# Patient Record
Sex: Female | Born: 1980 | Race: White | Hispanic: No | Marital: Married | Smoking: Never smoker
Health system: Southern US, Community
[De-identification: ages and names within clinical notes are randomized; demographics above are authoritative.]

## PROBLEM LIST (undated history)

## (undated) DIAGNOSIS — R102 Pelvic and perineal pain: Secondary | ICD-10-CM

## (undated) DIAGNOSIS — M5412 Radiculopathy, cervical region: Secondary | ICD-10-CM

## (undated) DIAGNOSIS — F419 Anxiety disorder, unspecified: Secondary | ICD-10-CM

## (undated) DIAGNOSIS — N946 Dysmenorrhea, unspecified: Secondary | ICD-10-CM

## (undated) DIAGNOSIS — Z8489 Family history of other specified conditions: Secondary | ICD-10-CM

## (undated) DIAGNOSIS — M4802 Spinal stenosis, cervical region: Secondary | ICD-10-CM

## (undated) DIAGNOSIS — D649 Anemia, unspecified: Secondary | ICD-10-CM

## (undated) DIAGNOSIS — N923 Ovulation bleeding: Secondary | ICD-10-CM

## (undated) DIAGNOSIS — E039 Hypothyroidism, unspecified: Secondary | ICD-10-CM

## (undated) DIAGNOSIS — Z9889 Other specified postprocedural states: Secondary | ICD-10-CM

## (undated) DIAGNOSIS — E282 Polycystic ovarian syndrome: Secondary | ICD-10-CM

## (undated) DIAGNOSIS — R7303 Prediabetes: Secondary | ICD-10-CM

## (undated) DIAGNOSIS — T8859XA Other complications of anesthesia, initial encounter: Secondary | ICD-10-CM

## (undated) DIAGNOSIS — R112 Nausea with vomiting, unspecified: Secondary | ICD-10-CM

## (undated) DIAGNOSIS — E0590 Thyrotoxicosis, unspecified without thyrotoxic crisis or storm: Secondary | ICD-10-CM

## (undated) HISTORY — DX: Ovulation bleeding: N92.3

## (undated) HISTORY — PX: DILATION AND CURETTAGE OF UTERUS: SHX78

## (undated) HISTORY — DX: Thyrotoxicosis, unspecified without thyrotoxic crisis or storm: E05.90

## (undated) HISTORY — DX: Polycystic ovarian syndrome: E28.2

## (undated) HISTORY — PX: BREAST LUMPECTOMY: SHX2

## (undated) HISTORY — DX: Pelvic and perineal pain: R10.2

## (undated) HISTORY — DX: Dysmenorrhea, unspecified: N94.6

---

## 2006-05-18 ENCOUNTER — Emergency Department: Payer: Self-pay | Admitting: Emergency Medicine

## 2007-11-25 ENCOUNTER — Observation Stay: Payer: Self-pay

## 2007-11-26 ENCOUNTER — Observation Stay: Payer: Self-pay | Admitting: Obstetrics and Gynecology

## 2008-01-19 ENCOUNTER — Inpatient Hospital Stay: Payer: Self-pay

## 2009-07-30 ENCOUNTER — Inpatient Hospital Stay: Payer: Self-pay

## 2010-02-22 ENCOUNTER — Emergency Department: Payer: Self-pay | Admitting: Emergency Medicine

## 2010-08-08 HISTORY — PX: HYSTEROSCOPY: SHX211

## 2010-08-19 ENCOUNTER — Ambulatory Visit: Payer: Self-pay | Admitting: Unknown Physician Specialty

## 2010-08-30 ENCOUNTER — Ambulatory Visit: Payer: Self-pay | Admitting: Unknown Physician Specialty

## 2014-09-28 LAB — HM PAP SMEAR: HM Pap smear: NEGATIVE

## 2015-04-27 LAB — TSH: TSH: 1.48 u[IU]/mL (ref 0.41–5.90)

## 2015-04-27 LAB — BASIC METABOLIC PANEL
Glucose: 76 mg/dL
Potassium: 4.1 mmol/L (ref 3.4–5.3)

## 2015-07-30 ENCOUNTER — Encounter: Payer: Self-pay | Admitting: *Deleted

## 2015-08-01 ENCOUNTER — Ambulatory Visit (INDEPENDENT_AMBULATORY_CARE_PROVIDER_SITE_OTHER): Payer: Self-pay | Admitting: Obstetrics and Gynecology

## 2015-08-01 ENCOUNTER — Encounter: Payer: Self-pay | Admitting: Obstetrics and Gynecology

## 2015-08-01 VITALS — BP 117/80 | HR 82 | Wt 132.6 lb

## 2015-08-01 DIAGNOSIS — N926 Irregular menstruation, unspecified: Secondary | ICD-10-CM

## 2015-08-01 DIAGNOSIS — N930 Postcoital and contact bleeding: Secondary | ICD-10-CM

## 2015-08-01 DIAGNOSIS — R5383 Other fatigue: Secondary | ICD-10-CM

## 2015-08-01 DIAGNOSIS — Z8742 Personal history of other diseases of the female genital tract: Secondary | ICD-10-CM

## 2015-08-01 NOTE — Progress Notes (Signed)
Subjective:     Patient ID: Donna Mejia, female   DOB: 1981/05/08, 34 y.o.   MRN: [id number]  HPI Reports regular menses with spotting 3-4 days around time of ovulation x 6 months; h/o PCOS with irregular menses  Review of Systems Menstrual history as follows: May 4-7 spotting May 24-29 menses June 8-9 spotting June 23-29 menses July 10-13 spotting July 25-31 menses Aug 14-18 spotting Aug 21 large gush bright red blood after sex  Feels bloating when spotting and really foggy and tired.    Objective:   Physical Exam A&O x4 Well groomed female in no current distress Pelvic exam: normal external genitalia, vulva, vagina, cervix, uterus and adnexa, CERVIX: normal appearing cervix without discharge or lesions, Nabothian cyst at 2 o'clock. UA negative    Assessment:     BTB between menses with bloating postcoital bleeding secondary to nabothian cyst  h/o PCOS    Plan:     Pelvic u/s between next menses  cunseled on ruptured nabothian cyst  Gennie Alma, CNM

## 2015-08-21 ENCOUNTER — Ambulatory Visit: Payer: Self-pay | Admitting: Obstetrics and Gynecology

## 2015-08-21 ENCOUNTER — Other Ambulatory Visit: Payer: Self-pay

## 2015-08-28 ENCOUNTER — Ambulatory Visit (INDEPENDENT_AMBULATORY_CARE_PROVIDER_SITE_OTHER): Payer: 59 | Admitting: Obstetrics and Gynecology

## 2015-08-28 ENCOUNTER — Encounter: Payer: Self-pay | Admitting: Obstetrics and Gynecology

## 2015-08-28 ENCOUNTER — Ambulatory Visit: Payer: 59

## 2015-08-28 VITALS — BP 113/70 | HR 65 | Ht 66.0 in | Wt 129.7 lb

## 2015-08-28 DIAGNOSIS — N926 Irregular menstruation, unspecified: Secondary | ICD-10-CM

## 2015-08-28 NOTE — Progress Notes (Signed)
Patient ID: Donna Mejia, female   DOB: 08-14-1981, 34 y.o.   MRN: [id number]   Indications: DUB, Bloating and PCOS. Findings:  The uterus measures 6.9 x 4.8 x 5.3 cm. It is retroverted and septated. Echo texture is homogenous without evidence of focal masses.  The Endometrium measures: Right: 5.4 mm. Left: 4.0 mm.   Right Ovary measures 4.3 x 1.8 x 1.8  cm. Multiple cystic areas consistent with PCOS are seen. Left Ovary measures 3.7 x 1.6 x 2.6 cm. As with the right ovary, multiple cystic areas are seen consistent with patient's known PCOS.  Survey of the adnexa demonstrates no adnexal masses. There is no free fluid in the cul de sac.  Impression: 1. Retroverted, septated uterus.  2. PCOS.    Declines medications to regulate menses at this time.  Melody Trudee Kuster, CNM

## 2015-10-02 ENCOUNTER — Other Ambulatory Visit: Payer: Self-pay | Admitting: Obstetrics and Gynecology

## 2015-10-02 ENCOUNTER — Encounter: Payer: Self-pay | Admitting: Obstetrics and Gynecology

## 2015-10-02 ENCOUNTER — Ambulatory Visit (INDEPENDENT_AMBULATORY_CARE_PROVIDER_SITE_OTHER): Payer: 59 | Admitting: Obstetrics and Gynecology

## 2015-10-02 VITALS — BP 101/67 | HR 77 | Wt 132.2 lb

## 2015-10-02 DIAGNOSIS — R5383 Other fatigue: Secondary | ICD-10-CM | POA: Diagnosis not present

## 2015-10-02 DIAGNOSIS — Z01419 Encounter for gynecological examination (general) (routine) without abnormal findings: Secondary | ICD-10-CM

## 2015-10-02 DIAGNOSIS — N921 Excessive and frequent menstruation with irregular cycle: Secondary | ICD-10-CM | POA: Diagnosis not present

## 2015-10-02 NOTE — Progress Notes (Signed)
Subjective:   Donna Mejia is a 34 y.o. G13P0 Caucasian female here for a routine well-woman exam.  No LMP recorded. Patient is not currently having periods (Reason: Other).    Current complaints: heavy menses with occasional spotting in-between, fatigue PCP: NA       Does need & desire labs  Social History: Sexual: heterosexual Marital Status: married Living situation: with family Occupation: homemaker Tobacco/alcohol: no tobacco use Illicit drugs: no history of illicit drug use  The following portions of the patient's history were reviewed and updated as appropriate: allergies, current medications, past family history, past medical history, past social history, past surgical history and problem list.  Past Medical History Past Medical History  Diagnosis Date  . PCOD (polycystic ovarian disease)   . Pelvic pain in female   . IMB (intermenstrual bleeding)   . Hyperthyroidism   . Dysmenorrhea     Past Surgical History Past Surgical History  Procedure Laterality Date  . Hysteroscopy  08/2010    secretory endometrium    Gynecologic History G1P0  No LMP recorded. Patient is not currently having periods (Reason: Other). Contraception: vasectomy Last Pap: 2015. Results were: normal  Obstetric History OB History  Gravida Para Term Preterm AB SAB TAB Ectopic Multiple Living  1             # Outcome Date GA Lbr Len/2nd Weight Sex Delivery Anes PTL Lv  1 Gravida               Current Medications Current Outpatient Prescriptions on File Prior to Visit  Medication Sig Dispense Refill  . methimazole (TAPAZOLE) 5 MG tablet Take 5 mg by mouth daily.     No current facility-administered medications on file prior to visit.    Review of Systems Patient denies any headaches, blurred vision, shortness of breath, chest pain, abdominal pain, problems with bowel movements, urination, or intercourse.  Objective:  BP 101/67 mmHg  Pulse 77  Wt 132 lb 3.2 oz (59.966  kg) Physical Exam  General:  Well developed, well nourished, no acute distress. She is alert and oriented x3. Skin:  Warm and dry Neck:  Midline trachea, no thyromegaly or nodules Cardiovascular: Regular rate and rhythm, no murmur heard Lungs:  Effort normal, all lung fields clear to auscultation bilaterally Breasts:  No dominant palpable mass, retraction, or nipple discharge Abdomen:  Soft, non tender, no hepatosplenomegaly or masses Pelvic:  External genitalia is normal in appearance.  The vagina is normal in appearance. The cervix is bulbous, no CMT.  Thin prep pap is done with HR HPV cotesting. Uterus is felt to be normal size, shape, and contour.  No adnexal masses or tenderness noted. Extremities:  No swelling or varicosities noted Psych:  She has a normal mood and affect  Assessment:   Healthy well-woman exam Menorrhagia Fatigue   Plan:  Pap and labs obtained F/U 1 year for AE, or sooner if needed  Yael Angerer Valene Bors, CNM

## 2015-10-02 NOTE — Patient Instructions (Signed)
  Place annual gynecologic exam patient instructions here.  Thank you for enrolling in [place]. Please follow the instructions below to securely access your online medical record. MyChart allows you to send messages to your doctor, view your test results, manage appointments, and more.   How Do I Sign Up? 1. In your Internet browser, go to [employer] and enter [email or web address]. 2. Click on the Sign Up Now link in the Sign In box. You will see the New Member Sign Up page. 3. Enter your MyChart Access Code exactly as it appears below. You will not need to use this code after you've completed the sign-up process. If you do not sign up before the expiration date, you must request a new code.  MyChart Access Code: [email or web address] Expires: 12/01/2015  8:32 AM  4. Enter your Social Security Number ([id number]) and Date of Birth (mm/dd/yyyy) as indicated and click Submit. You will be taken to the next sign-up page. 5. Create a MyChart ID. This will be your MyChart login ID and cannot be changed, so think of one that is secure and easy to remember. 6. Create a MyChart password. You can change your password at any time. 7. Enter your Password Reset Question and Answer. This can be used at a later time if you forget your password.  8. Enter your e-mail address. You will receive e-mail notification when new information is available in [place]. 9. Click Sign Up. You can now view your medical record.   Additional Information Remember, MyChart is NOT to be used for urgent needs. For medical emergencies, dial 911.

## 2015-10-03 LAB — LIPID PANEL
CHOLESTEROL TOTAL: 201 mg/dL — AB (ref 100–199)
Chol/HDL Ratio: 3.1 ratio units (ref 0.0–4.4)
HDL: 64 mg/dL (ref >39)
LDL CALC: 121 mg/dL — AB (ref 0–99)
Triglycerides: 79 mg/dL (ref 0–149)
VLDL CHOLESTEROL CAL: 16 mg/dL (ref 5–40)

## 2015-10-03 LAB — COMPREHENSIVE METABOLIC PANEL
ALT: 12 IU/L (ref 0–32)
AST: 14 IU/L (ref 0–40)
Albumin/Globulin Ratio: 1.8 (ref 1.1–2.5)
Albumin: 4.4 g/dL (ref 3.5–5.5)
Alkaline Phosphatase: 42 IU/L (ref 39–117)
BUN / CREAT RATIO: 16 (ref 8–20)
BUN: 12 mg/dL (ref 6–20)
Bilirubin Total: 0.5 mg/dL (ref 0.0–1.2)
CALCIUM: 9.3 mg/dL (ref 8.7–10.2)
CO2: 24 mmol/L (ref 18–29)
Chloride: 101 mmol/L (ref 97–106)
Creatinine, Ser: 0.73 mg/dL (ref 0.57–1.00)
GFR, EST AFRICAN AMERICAN: 123 mL/min/{1.73_m2} (ref >59)
GFR, EST NON AFRICAN AMERICAN: 107 mL/min/{1.73_m2} (ref >59)
GLOBULIN, TOTAL: 2.4 g/dL (ref 1.5–4.5)
GLUCOSE: 84 mg/dL (ref 65–99)
Potassium: 4.2 mmol/L (ref 3.5–5.2)
SODIUM: 138 mmol/L (ref 136–144)
TOTAL PROTEIN: 6.8 g/dL (ref 6.0–8.5)

## 2015-10-03 LAB — CBC
Hematocrit: 40.6 % (ref 34.0–46.6)
Hemoglobin: 13.7 g/dL (ref 11.1–15.9)
MCH: 30 pg (ref 26.6–33.0)
MCHC: 33.7 g/dL (ref 31.5–35.7)
MCV: 89 fL (ref 79–97)
Platelets: 193 10*3/uL (ref 150–379)
RBC: 4.56 x10E6/uL (ref 3.77–5.28)
RDW: 13.4 % (ref 12.3–15.4)
WBC: 5.9 10*3/uL (ref 3.4–10.8)

## 2015-10-03 LAB — THYROID PANEL WITH TSH
FREE THYROXINE INDEX: 1.8 (ref 1.2–4.9)
T3 UPTAKE RATIO: 24 % (ref 24–39)
T4, Total: 7.7 ug/dL (ref 4.5–12.0)
TSH: 1.04 u[IU]/mL (ref 0.450–4.500)

## 2015-10-03 LAB — FERRITIN: Ferritin: 34 ng/mL (ref 15–150)

## 2015-10-03 LAB — VITAMIN B12: VITAMIN B 12: 931 pg/mL (ref 211–946)

## 2015-10-05 LAB — CYTOLOGY - PAP

## 2015-10-08 ENCOUNTER — Telehealth: Payer: Self-pay | Admitting: *Deleted

## 2015-10-08 NOTE — Telephone Encounter (Signed)
-----   Message from Evonnie Pat, [place] sent at 10/05/2015  8:27 AM EDT ----- Please let her know all labs look normal, cholesterol a little elevated, but not worrisome

## 2015-10-08 NOTE — Telephone Encounter (Signed)
Notified pt of results 

## 2016-01-15 ENCOUNTER — Telehealth: Payer: Self-pay | Admitting: Obstetrics and Gynecology

## 2016-01-15 NOTE — Telephone Encounter (Signed)
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 The doctor suggested Biltricide/ praviquantel or Albenza. He said she would need 3 rounds of it once a month for 3 months .......[facility]

## 2016-01-16 ENCOUNTER — Other Ambulatory Visit: Payer: Self-pay | Admitting: Obstetrics and Gynecology

## 2016-01-16 MED ORDER — ALBENDAZOLE 200 MG PO TABS: 400.0000 mg | ORAL_TABLET | Freq: Two times a day (BID) | ORAL | Status: DC

## 2016-01-16 NOTE — Telephone Encounter (Signed)
Please let her know I did a prescription for Albendazole which is a generic that would treat it. I recommend checking at [facility] [place] and [facility] for cheaper prices, and I wrote it so that she and her spouse can take it, it needs to be 2 tablets a day for 2 days each month x 3 months. I also gave refills as they kids would take 1 tablet a day for 2 days and repeat x 3 months. The one prescription should be enough for everyone.

## 2016-01-16 NOTE — Telephone Encounter (Signed)
Notified pt and she will have her husband pick it up

## 2016-01-16 NOTE — Telephone Encounter (Signed)
This pt found out the medication for the parasites is over $2000.00 for each person. Hold off on ordering anything she is working with the ins to find something cheaper.

## 2016-05-15 ENCOUNTER — Telehealth: Payer: Self-pay | Admitting: Obstetrics and Gynecology

## 2016-05-15 NOTE — Telephone Encounter (Signed)
Pt feels like her hormones are out of whack, mental games, one min she is fine and next min she is not. She feels out of control. Week before period she said she is a monster.

## 2016-05-15 NOTE — Telephone Encounter (Signed)
Patient called and states she has not received a call back . She was just inquiring about her message from this morning. Thanks

## 2016-05-15 NOTE — Telephone Encounter (Signed)
Only thing I can recommend is a mild antidepressant. I can send it in if she wants to try one for a few months.

## 2016-05-16 ENCOUNTER — Other Ambulatory Visit: Payer: Self-pay | Admitting: Obstetrics and Gynecology

## 2016-05-16 ENCOUNTER — Other Ambulatory Visit: Payer: Self-pay

## 2016-05-16 ENCOUNTER — Telehealth: Payer: Self-pay | Admitting: *Deleted

## 2016-05-16 DIAGNOSIS — F419 Anxiety disorder, unspecified: Secondary | ICD-10-CM | POA: Insufficient documentation

## 2016-05-16 MED ORDER — FLUOXETINE HCL 10 MG PO CAPS: 10.0000 mg | ORAL_CAPSULE | Freq: Every day | ORAL | Status: DC

## 2016-05-16 NOTE — Telephone Encounter (Signed)
Please let her know I sent in a RX for prozac- I want her to take one each morning and let me know how she is feeling after 4 weeks, and that i normally suggest they continue them for at least 6 months.

## 2016-05-16 NOTE — Telephone Encounter (Signed)
Patient called back and states she wants to try a mild antidepressant for a few months. She didn't know what you would suggest for her to take. [facility] [place]. Thanks

## 2016-05-16 NOTE — Telephone Encounter (Signed)
Pt would like to try antidepressant. [facility].

## 2016-05-16 NOTE — Telephone Encounter (Signed)
Pt aware med erx. [facility] [address]

## 2016-05-19 ENCOUNTER — Telehealth: Payer: Self-pay | Admitting: Obstetrics and Gynecology

## 2016-05-19 NOTE — Telephone Encounter (Signed)
The medication you gave Donna Mejia has made a big difference except for there fact that she has broken out with rash on the sides of her face. Very itchy from cheek to jaw bone. She said she didn't want to quit taking it because its working.

## 2016-05-19 NOTE — Telephone Encounter (Signed)
Pt aware per vm.  

## 2016-05-19 NOTE — Telephone Encounter (Signed)
Not a common reaction to prozac, have her keep taking it and see if rash resolves.

## 2016-06-02 ENCOUNTER — Telehealth: Payer: Self-pay | Admitting: *Deleted

## 2016-06-02 NOTE — Telephone Encounter (Signed)
Patient called and stated that Melody prescribed her Prozac and the 1st month it was great. Patient " felt good about herself" patient stated that it changed the week before her cycle. Her cycle will start next week.  Patient states she is having  mood swings and she is waking up in the middle of the night. She gets overwhelmed at times.  She is suggesting that she needs something else. Patient is requesting call back. Her call back number is [phone]

## 2016-06-02 NOTE — Telephone Encounter (Signed)
pls advise

## 2016-06-03 NOTE — Telephone Encounter (Signed)
Please remind her that the medication is not a 100% fix, and that hormonal fluctuations will still occur. I want her to give it another 2 months and see how she feels around her period.

## 2016-06-04 NOTE — Telephone Encounter (Signed)
Left pt detailed message.

## 2016-08-04 ENCOUNTER — Other Ambulatory Visit: Payer: Self-pay | Admitting: *Deleted

## 2016-08-04 MED ORDER — FLUOXETINE HCL 10 MG PO CAPS: 10.0000 mg | ORAL_CAPSULE | Freq: Every day | ORAL | 2 refills | Status: DC

## 2016-08-15 ENCOUNTER — Other Ambulatory Visit: Payer: Self-pay | Admitting: Cardiology

## 2016-08-15 DIAGNOSIS — R0789 Other chest pain: Secondary | ICD-10-CM

## 2016-08-18 ENCOUNTER — Other Ambulatory Visit: Payer: Self-pay | Admitting: Cardiology

## 2016-08-19 ENCOUNTER — Other Ambulatory Visit: Payer: Self-pay | Admitting: Cardiology

## 2016-08-19 DIAGNOSIS — R0789 Other chest pain: Secondary | ICD-10-CM

## 2016-08-27 ENCOUNTER — Encounter (HOSPITAL_COMMUNITY): Payer: Self-pay

## 2016-08-27 ENCOUNTER — Ambulatory Visit (HOSPITAL_COMMUNITY)
Admission: RE | Admit: 2016-08-27 | Discharge: 2016-08-27 | Disposition: A | Discharge status: Home / Self Care | Payer: 59 | Source: Ambulatory Visit | Attending: Cardiology | Admitting: Cardiology

## 2016-08-27 DIAGNOSIS — R0789 Other chest pain: Secondary | ICD-10-CM | POA: Diagnosis not present

## 2016-08-27 MED ORDER — NITROGLYCERIN 0.4 MG SL SUBL
SUBLINGUAL_TABLET | SUBLINGUAL | Status: AC
  Filled 2016-08-27: qty 1

## 2016-08-27 MED ORDER — NITROGLYCERIN 0.4 MG SL SUBL
0.4000 mg | SUBLINGUAL_TABLET | Freq: Once | SUBLINGUAL | Status: AC
  Administered 2016-08-27: 0.4 mg via SUBLINGUAL

## 2016-08-27 MED ORDER — IOPAMIDOL (ISOVUE-370) INJECTION 76%
INTRAVENOUS | Status: AC
  Administered 2016-08-27: 80 mL
  Filled 2016-08-27: qty 100

## 2016-08-27 MED ORDER — METOPROLOL TARTRATE 50 MG PO TABS
ORAL_TABLET | ORAL | Status: AC
  Filled 2016-08-27: qty 1

## 2016-08-27 MED ORDER — METOPROLOL TARTRATE 50 MG PO TABS
50.0000 mg | ORAL_TABLET | Freq: Once | ORAL | Status: AC
  Administered 2016-08-27: 50 mg via ORAL

## 2016-10-02 ENCOUNTER — Encounter: Payer: 59 | Admitting: Obstetrics and Gynecology

## 2016-10-09 ENCOUNTER — Encounter: Payer: 59 | Admitting: Obstetrics and Gynecology

## 2016-10-14 ENCOUNTER — Ambulatory Visit (INDEPENDENT_AMBULATORY_CARE_PROVIDER_SITE_OTHER): Payer: 59 | Admitting: Obstetrics and Gynecology

## 2016-10-14 ENCOUNTER — Encounter: Payer: Self-pay | Admitting: Obstetrics and Gynecology

## 2016-10-14 VITALS — BP 123/72 | HR 71 | Ht 66.0 in | Wt 133.9 lb

## 2016-10-14 DIAGNOSIS — Z01411 Encounter for gynecological examination (general) (routine) with abnormal findings: Secondary | ICD-10-CM

## 2016-10-14 DIAGNOSIS — Z01419 Encounter for gynecological examination (general) (routine) without abnormal findings: Secondary | ICD-10-CM | POA: Diagnosis not present

## 2016-10-14 MED ORDER — FLUOXETINE HCL 10 MG PO CAPS: 10.0000 mg | ORAL_CAPSULE | Freq: Every day | ORAL | 4 refills | Status: DC

## 2016-10-14 NOTE — Patient Instructions (Addendum)
Place annual gynecologic exam patient instructions here.  Thank you for enrolling in [facility]. Please follow the instructions below to securely access your online medical record. MyChart allows you to send messages to your doctor, view your test results, manage appointments, and more.   How Do I Sign Up? 1. In your Internet browser, go to [employer] and enter [email or web address]. 2. Click on the Sign Up Now link in the Sign In box. You will see the New Member Sign Up page. 3. Enter your MyChart Access Code exactly as it appears below. You will not need to use this code after you've completed the sign-up process. If you do not sign up before the expiration date, you must request a new code.  MyChart Access Code: [email or web address] Expires: 12/13/2016  8:56 AM  4. Enter your Social Security Number ([id number]) and Date of Birth (mm/dd/yyyy) as indicated and click Submit. You will be taken to the next sign-up page. 5. Create a MyChart ID. This will be your MyChart login ID and cannot be changed, so think of one that is secure and easy to remember. 6. Create a MyChart password. You can change your password at any time. 7. Enter your Password Reset Question and Answer. This can be used at a later time if you forget your password.  8. Enter your e-mail address. You will receive e-mail notification when new information is available in [facility]. 9. Click Sign Up. You can now view your medical record.   Additional Information Remember, MyChart is NOT to be used for urgent needs. For medical emergencies, dial 911.     Preventive Care for Adults, Female A healthy lifestyle and preventive care can promote health and wellness. Preventive health guidelines for women include the following key practices.  A routine yearly physical is a good way to check with your health care provider about your health and preventive screening. It is a chance to share any concerns and updates on your health  and to receive a thorough exam.  Visit your dentist for a routine exam and preventive care every 6 months. Brush your teeth twice a day and floss once a day. Good oral hygiene prevents tooth decay and gum disease.  The frequency of eye exams is based on your age, health, family medical history, use of contact lenses, and other factors. Follow your health care provider's recommendations for frequency of eye exams.  Eat a healthy diet. Foods like vegetables, fruits, whole grains, low-fat dairy products, and lean protein foods contain the nutrients you need without too many calories. Decrease your intake of foods high in solid fats, added sugars, and salt. Eat the right amount of calories for you.Get information about a proper diet from your health care provider, if necessary.  Regular physical exercise is one of the most important things you can do for your health. Most adults should get at least 150 minutes of moderate-intensity exercise (any activity that increases your heart rate and causes you to sweat) each week. In addition, most adults need muscle-strengthening exercises on 2 or more days a week.  Maintain a healthy weight. The body mass index (BMI) is a screening tool to identify possible weight problems. It provides an estimate of body fat based on height and weight. Your health care provider can find your BMI and can help you achieve or maintain a healthy weight.For adults 20 years and older:  A BMI below 18.5 is considered underweight.  A BMI of 18.5 to 24.9 is normal.  A  BMI of 25 to 29.9 is considered overweight.  A BMI of 30 and above is considered obese.  Maintain normal blood lipids and cholesterol levels by exercising and minimizing your intake of saturated fat. Eat a balanced diet with plenty of fruit and vegetables. Blood tests for lipids and cholesterol should begin at age 76 and be repeated every 5 years. If your lipid or cholesterol levels are high, you are over 50, or you  are at high risk for heart disease, you may need your cholesterol levels checked more frequently.Ongoing high lipid and cholesterol levels should be treated with medicines if diet and exercise are not working.  If you smoke, find out from your health care provider how to quit. If you do not use tobacco, do not start.  Lung cancer screening is recommended for adults aged 60-80 years who are at high risk for developing lung cancer because of a history of smoking. A yearly low-dose CT scan of the lungs is recommended for people who have at least a 30-pack-year history of smoking and are a current smoker or have quit within the past 15 years. A pack year of smoking is smoking an average of 1 pack of cigarettes a day for 1 year (for example: 1 pack a day for 30 years or 2 packs a day for 15 years). Yearly screening should continue until the smoker has stopped smoking for at least 15 years. Yearly screening should be stopped for people who develop a health problem that would prevent them from having lung cancer treatment.  If you are pregnant, do not drink alcohol. If you are breastfeeding, be very cautious about drinking alcohol. If you are not pregnant and choose to drink alcohol, do not have more than 1 drink per day. One drink is considered to be 12 ounces (355 mL) of beer, 5 ounces (148 mL) of wine, or 1.5 ounces (44 mL) of liquor.  Avoid use of street drugs. Do not share needles with anyone. Ask for help if you need support or instructions about stopping the use of drugs.  High blood pressure causes heart disease and increases the risk of stroke. Your blood pressure should be checked at least every 1 to 2 years. Ongoing high blood pressure should be treated with medicines if weight loss and exercise do not work.  If you are 59-53 years old, ask your health care provider if you should take aspirin to prevent strokes.  Diabetes screening is done by taking a blood sample to check your blood glucose level  after you have not eaten for a certain period of time (fasting). If you are not overweight and you do not have risk factors for diabetes, you should be screened once every 3 years starting at age 78. If you are overweight or obese and you are 70-40 years of age, you should be screened for diabetes every year as part of your cardiovascular risk assessment.  Breast cancer screening is essential preventive care for women. You should practice "breast self-awareness." This means understanding the normal appearance and feel of your breasts and may include breast self-examination. Any changes detected, no matter how small, should be reported to a health care provider. Women in their 59s and 30s should have a clinical breast exam (CBE) by a health care provider as part of a regular health exam every 1 to 3 years. After age 37, women should have a CBE every year. Starting at age 52, women should consider having a mammogram (breast X-ray test)  every year. Women who have a family history of breast cancer should talk to their health care provider about genetic screening. Women at a high risk of breast cancer should talk to their health care providers about having an MRI and a mammogram every year.  Breast cancer gene (BRCA)-related cancer risk assessment is recommended for women who have family members with BRCA-related cancers. BRCA-related cancers include breast, ovarian, tubal, and peritoneal cancers. Having family members with these cancers may be associated with an increased risk for harmful changes (mutations) in the breast cancer genes BRCA1 and BRCA2. Results of the assessment will determine the need for genetic counseling and BRCA1 and BRCA2 testing.  Your health care provider may recommend that you be screened regularly for cancer of the pelvic organs (ovaries, uterus, and vagina). This screening involves a pelvic examination, including checking for microscopic changes to the surface of your cervix (Pap test).  You may be encouraged to have this screening done every 3 years, beginning at age 61.  For women ages 49-65, health care providers may recommend pelvic exams and Pap testing every 3 years, or they may recommend the Pap and pelvic exam, combined with testing for human papilloma virus (HPV), every 5 years. Some types of HPV increase your risk of cervical cancer. Testing for HPV may also be done on women of any age with unclear Pap test results.  Other health care providers may not recommend any screening for nonpregnant women who are considered low risk for pelvic cancer and who do not have symptoms. Ask your health care provider if a screening pelvic exam is right for you.  If you have had past treatment for cervical cancer or a condition that could lead to cancer, you need Pap tests and screening for cancer for at least 20 years after your treatment. If Pap tests have been discontinued, your risk factors (such as having a new sexual partner) need to be reassessed to determine if screening should resume. Some women have medical problems that increase the chance of getting cervical cancer. In these cases, your health care provider may recommend more frequent screening and Pap tests.  Colorectal cancer can be detected and often prevented. Most routine colorectal cancer screening begins at the age of 31 years and continues through age 27 years. However, your health care provider may recommend screening at an earlier age if you have risk factors for colon cancer. On a yearly basis, your health care provider may provide home test kits to check for hidden blood in the stool. Use of a small camera at the end of a tube, to directly examine the colon (sigmoidoscopy or colonoscopy), can detect the earliest forms of colorectal cancer. Talk to your health care provider about this at age 23, when routine screening begins. Direct exam of the colon should be repeated every 5-10 years through age 62 years, unless early  forms of precancerous polyps or small growths are found.  People who are at an increased risk for hepatitis B should be screened for this virus. You are considered at high risk for hepatitis B if:  You were born in a country where hepatitis B occurs often. Talk with your health care provider about which countries are considered high risk.  Your parents were born in a high-risk country and you have not received a shot to protect against hepatitis B (hepatitis B vaccine).  You have HIV or AIDS.  You use needles to inject street drugs.  You live with, or have sex  with, someone who has hepatitis B.  You get hemodialysis treatment.  You take certain medicines for conditions like cancer, organ transplantation, and autoimmune conditions.  Hepatitis C blood testing is recommended for all people born from [birth year] through 1966 and any individual with known risks for hepatitis C.  Practice safe sex. Use condoms and avoid high-risk sexual practices to reduce the spread of sexually transmitted infections (STIs). STIs include gonorrhea, chlamydia, syphilis, trichomonas, herpes, HPV, and human immunodeficiency virus (HIV). Herpes, HIV, and HPV are viral illnesses that have no cure. They can result in disability, cancer, and death.  You should be screened for sexually transmitted illnesses (STIs) including gonorrhea and chlamydia if:  You are sexually active and are younger than 24 years.  You are older than 24 years and your health care provider tells you that you are at risk for this type of infection.  Your sexual activity has changed since you were last screened and you are at an increased risk for chlamydia or gonorrhea. Ask your health care provider if you are at risk.  If you are at risk of being infected with HIV, it is recommended that you take a prescription medicine daily to prevent HIV infection. This is called preexposure prophylaxis (PrEP). You are considered at risk if:  You are sexually  active and do not regularly use condoms or know the HIV status of your partner(s).  You take drugs by injection.  You are sexually active with a partner who has HIV.  Talk with your health care provider about whether you are at high risk of being infected with HIV. If you choose to begin PrEP, you should first be tested for HIV. You should then be tested every 3 months for as long as you are taking PrEP.  Osteoporosis is a disease in which the bones lose minerals and strength with aging. This can result in serious bone fractures or breaks. The risk of osteoporosis can be identified using a bone density scan. Women ages 20 years and over and women at risk for fractures or osteoporosis should discuss screening with their health care providers. Ask your health care provider whether you should take a calcium supplement or vitamin D to reduce the rate of osteoporosis.  Menopause can be associated with physical symptoms and risks. Hormone replacement therapy is available to decrease symptoms and risks. You should talk to your health care provider about whether hormone replacement therapy is right for you.  Use sunscreen. Apply sunscreen liberally and repeatedly throughout the day. You should seek shade when your shadow is shorter than you. Protect yourself by wearing long sleeves, pants, a wide-brimmed hat, and sunglasses year round, whenever you are outdoors.  Once a month, do a whole body skin exam, using a mirror to look at the skin on your back. Tell your health care provider of new moles, moles that have irregular borders, moles that are larger than a pencil eraser, or moles that have changed in shape or color.  Stay current with required vaccines (immunizations).  Influenza vaccine. All adults should be immunized every year.  Tetanus, diphtheria, and acellular pertussis (Td, Tdap) vaccine. Pregnant women should receive 1 dose of Tdap vaccine during each pregnancy. The dose should be obtained  regardless of the length of time since the last dose. Immunization is preferred during the 27th-36th week of gestation. An adult who has not previously received Tdap or who does not know her vaccine status should receive 1 dose of Tdap. This initial dose  should be followed by tetanus and diphtheria toxoids (Td) booster doses every 10 years. Adults with an unknown or incomplete history of completing a 3-dose immunization series with Td-containing vaccines should begin or complete a primary immunization series including a Tdap dose. Adults should receive a Td booster every 10 years.  Varicella vaccine. An adult without evidence of immunity to varicella should receive 2 doses or a second dose if she has previously received 1 dose. Pregnant females who do not have evidence of immunity should receive the first dose after pregnancy. This first dose should be obtained before leaving the health care facility. The second dose should be obtained 4-8 weeks after the first dose.  Human papillomavirus (HPV) vaccine. Females aged 13-26 years who have not received the vaccine previously should obtain the 3-dose series. The vaccine is not recommended for use in pregnant females. However, pregnancy testing is not needed before receiving a dose. If a female is found to be pregnant after receiving a dose, no treatment is needed. In that case, the remaining doses should be delayed until after the pregnancy. Immunization is recommended for any person with an immunocompromised condition through the age of 6 years if she did not get any or all doses earlier. During the 3-dose series, the second dose should be obtained 4-8 weeks after the first dose. The third dose should be obtained 24 weeks after the first dose and 16 weeks after the second dose.  Zoster vaccine. One dose is recommended for adults aged 78 years or older unless certain conditions are present.  Measles, mumps, and rubella (MMR) vaccine. Adults born before [birth year]  generally are considered immune to measles and mumps. Adults born in [birth year] or later should have 1 or more doses of MMR vaccine unless there is a contraindication to the vaccine or there is laboratory evidence of immunity to each of the three diseases. A routine second dose of MMR vaccine should be obtained at least 28 days after the first dose for students attending postsecondary schools, health care workers, or international travelers. People who received inactivated measles vaccine or an unknown type of measles vaccine during 1963-1967 should receive 2 doses of MMR vaccine. People who received inactivated mumps vaccine or an unknown type of mumps vaccine before 1979 and are at high risk for mumps infection should consider immunization with 2 doses of MMR vaccine. For females of childbearing age, rubella immunity should be determined. If there is no evidence of immunity, females who are not pregnant should be vaccinated. If there is no evidence of immunity, females who are pregnant should delay immunization until after pregnancy. Unvaccinated health care workers born before [birth year] who lack laboratory evidence of measles, mumps, or rubella immunity or laboratory confirmation of disease should consider measles and mumps immunization with 2 doses of MMR vaccine or rubella immunization with 1 dose of MMR vaccine.  Pneumococcal 13-valent conjugate (PCV13) vaccine. When indicated, a person who is uncertain of his immunization history and has no record of immunization should receive the PCV13 vaccine. All adults 32 years of age and older should receive this vaccine. An adult aged 71 years or older who has certain medical conditions and has not been previously immunized should receive 1 dose of PCV13 vaccine. This PCV13 should be followed with a dose of pneumococcal polysaccharide (PPSV23) vaccine. Adults who are at high risk for pneumococcal disease should obtain the PPSV23 vaccine at least 8 weeks after the dose of PCV13  vaccine. Adults older than 36 years of  age who have normal immune system function should obtain the PPSV23 vaccine dose at least 1 year after the dose of PCV13 vaccine.  Pneumococcal polysaccharide (PPSV23) vaccine. When PCV13 is also indicated, PCV13 should be obtained first. All adults aged 21 years and older should be immunized. An adult younger than age 40 years who has certain medical conditions should be immunized. Any person who resides in a nursing home or long-term care facility should be immunized. An adult smoker should be immunized. People with an immunocompromised condition and certain other conditions should receive both PCV13 and PPSV23 vaccines. People with human immunodeficiency virus (HIV) infection should be immunized as soon as possible after diagnosis. Immunization during chemotherapy or radiation therapy should be avoided. Routine use of PPSV23 vaccine is not recommended for American Indians, [place] Natives, or people younger than 65 years unless there are medical conditions that require PPSV23 vaccine. When indicated, people who have unknown immunization and have no record of immunization should receive PPSV23 vaccine. One-time revaccination 5 years after the first dose of PPSV23 is recommended for people aged 19-64 years who have chronic kidney failure, nephrotic syndrome, asplenia, or immunocompromised conditions. People who received 1-2 doses of PPSV23 before age 14 years should receive another dose of PPSV23 vaccine at age 21 years or later if at least 5 years have passed since the previous dose. Doses of PPSV23 are not needed for people immunized with PPSV23 at or after age 35 years.  Meningococcal vaccine. Adults with asplenia or persistent complement component deficiencies should receive 2 doses of quadrivalent meningococcal conjugate (MenACWY-D) vaccine. The doses should be obtained at least 2 months apart. Microbiologists working with certain meningococcal bacteria, [employer]  recruits, people at risk during an outbreak, and people who travel to or live in countries with a high rate of meningitis should be immunized. A first-year college student up through age 89 years who is living in a residence hall should receive a dose if she did not receive a dose on or after her 16th birthday. Adults who have certain high-risk conditions should receive one or more doses of vaccine.  Hepatitis A vaccine. Adults who wish to be protected from this disease, have certain high-risk conditions, work with hepatitis A-infected animals, work in hepatitis A research labs, or travel to or work in countries with a high rate of hepatitis A should be immunized. Adults who were previously unvaccinated and who anticipate close contact with an international adoptee during the first 60 days after arrival in the [place] from a country with a high rate of hepatitis A should be immunized.  Hepatitis B vaccine. Adults who wish to be protected from this disease, have certain high-risk conditions, may be exposed to blood or other infectious body fluids, are household contacts or sex partners of hepatitis B positive people, are clients or workers in certain care facilities, or travel to or work in countries with a high rate of hepatitis B should be immunized.  Haemophilus influenzae type b (Hib) vaccine. A previously unvaccinated person with asplenia or sickle cell disease or having a scheduled splenectomy should receive 1 dose of Hib vaccine. Regardless of previous immunization, a recipient of a hematopoietic stem cell transplant should receive a 3-dose series 6-12 months after her successful transplant. Hib vaccine is not recommended for adults with HIV infection. Preventive Services / Frequency Ages 73 to 93 years  Blood pressure check.** / Every 3-5 years.  Lipid and cholesterol check.** / Every 5 years beginning at age 13.  Clinical breast exam.** / Every 3 years for women in their 37s and  33s.  BRCA-related cancer risk assessment.** / For women who have family members with a BRCA-related cancer (breast, ovarian, tubal, or peritoneal cancers).  Pap test.** / Every 2 years from ages 41 through 36. Every 3 years starting at age 16 through age 19 or 91 with a history of 3 consecutive normal Pap tests.  HPV screening.** / Every 3 years from ages 72 through ages 74 to 14 with a history of 3 consecutive normal Pap tests.  Hepatitis C blood test.** / For any individual with known risks for hepatitis C.  Skin self-exam. / Monthly.  Influenza vaccine. / Every year.  Tetanus, diphtheria, and acellular pertussis (Tdap, Td) vaccine.** / Consult your health care provider. Pregnant women should receive 1 dose of Tdap vaccine during each pregnancy. 1 dose of Td every 10 years.  Varicella vaccine.** / Consult your health care provider. Pregnant females who do not have evidence of immunity should receive the first dose after pregnancy.  HPV vaccine. / 3 doses over 6 months, if 49 and younger. The vaccine is not recommended for use in pregnant females. However, pregnancy testing is not needed before receiving a dose.  Measles, mumps, rubella (MMR) vaccine.** / You need at least 1 dose of MMR if you were born in 1957 or later. You may also need a 2nd dose. For females of childbearing age, rubella immunity should be determined. If there is no evidence of immunity, females who are not pregnant should be vaccinated. If there is no evidence of immunity, females who are pregnant should delay immunization until after pregnancy.  Pneumococcal 13-valent conjugate (PCV13) vaccine.** / Consult your health care provider.  Pneumococcal polysaccharide (PPSV23) vaccine.** / 1 to 2 doses if you smoke cigarettes or if you have certain conditions.  Meningococcal vaccine.** / 1 dose if you are age 62 to 61 years and a Market researcher living in a residence hall, or have one of several medical  conditions, you need to get vaccinated against meningococcal disease. You may also need additional booster doses.  Hepatitis A vaccine.** / Consult your health care provider.  Hepatitis B vaccine.** / Consult your health care provider.  Haemophilus influenzae type b (Hib) vaccine.** / Consult your health care provider. Ages 28 to 2 years  Blood pressure check.** / Every year.  Lipid and cholesterol check.** / Every 5 years beginning at age 25 years.  Lung cancer screening. / Every year if you are aged 20-80 years and have a 30-pack-year history of smoking and currently smoke or have quit within the past 15 years. Yearly screening is stopped once you have quit smoking for at least 15 years or develop a health problem that would prevent you from having lung cancer treatment.  Clinical breast exam.** / Every year after age 38 years.  BRCA-related cancer risk assessment.** / For women who have family members with a BRCA-related cancer (breast, ovarian, tubal, or peritoneal cancers).  Mammogram.** / Every year beginning at age 73 years and continuing for as long as you are in good health. Consult with your health care provider.  Pap test.** / Every 3 years starting at age 64 years through age 43 or 80 years with a history of 3 consecutive normal Pap tests.  HPV screening.** / Every 3 years from ages 70 years through ages 99 to 56 years with a history of 3 consecutive normal Pap tests.  Fecal occult blood test (FOBT)  of stool. / Every year beginning at age 79 years and continuing until age 35 years. You may not need to do this test if you get a colonoscopy every 10 years.  Flexible sigmoidoscopy or colonoscopy.** / Every 5 years for a flexible sigmoidoscopy or every 10 years for a colonoscopy beginning at age 40 years and continuing until age 82 years.  Hepatitis C blood test.** / For all people born from [birth year] through 1966 and any individual with known risks for hepatitis C.  Skin  self-exam. / Monthly.  Influenza vaccine. / Every year.  Tetanus, diphtheria, and acellular pertussis (Tdap/Td) vaccine.** / Consult your health care provider. Pregnant women should receive 1 dose of Tdap vaccine during each pregnancy. 1 dose of Td every 10 years.  Varicella vaccine.** / Consult your health care provider. Pregnant females who do not have evidence of immunity should receive the first dose after pregnancy.  Zoster vaccine.** / 1 dose for adults aged 75 years or older.  Measles, mumps, rubella (MMR) vaccine.** / You need at least 1 dose of MMR if you were born in 1957 or later. You may also need a second dose. For females of childbearing age, rubella immunity should be determined. If there is no evidence of immunity, females who are not pregnant should be vaccinated. If there is no evidence of immunity, females who are pregnant should delay immunization until after pregnancy.  Pneumococcal 13-valent conjugate (PCV13) vaccine.** / Consult your health care provider.  Pneumococcal polysaccharide (PPSV23) vaccine.** / 1 to 2 doses if you smoke cigarettes or if you have certain conditions.  Meningococcal vaccine.** / Consult your health care provider.  Hepatitis A vaccine.** / Consult your health care provider.  Hepatitis B vaccine.** / Consult your health care provider.  Haemophilus influenzae type b (Hib) vaccine.** / Consult your health care provider. Ages 50 years and over  Blood pressure check.** / Every year.  Lipid and cholesterol check.** / Every 5 years beginning at age 63 years.  Lung cancer screening. / Every year if you are aged 58-80 years and have a 30-pack-year history of smoking and currently smoke or have quit within the past 15 years. Yearly screening is stopped once you have quit smoking for at least 15 years or develop a health problem that would prevent you from having lung cancer treatment.  Clinical breast exam.** / Every year after age 13  years.  BRCA-related cancer risk assessment.** / For women who have family members with a BRCA-related cancer (breast, ovarian, tubal, or peritoneal cancers).  Mammogram.** / Every year beginning at age 30 years and continuing for as long as you are in good health. Consult with your health care provider.  Pap test.** / Every 3 years starting at age 1 years through age 33 or 79 years with 3 consecutive normal Pap tests. Testing can be stopped between 65 and 70 years with 3 consecutive normal Pap tests and no abnormal Pap or HPV tests in the past 10 years.  HPV screening.** / Every 3 years from ages 69 years through ages 70 or 71 years with a history of 3 consecutive normal Pap tests. Testing can be stopped between 65 and 70 years with 3 consecutive normal Pap tests and no abnormal Pap or HPV tests in the past 10 years.  Fecal occult blood test (FOBT) of stool. / Every year beginning at age 67 years and continuing until age 24 years. You may not need to do this test if you get a colonoscopy  every 10 years.  Flexible sigmoidoscopy or colonoscopy.** / Every 5 years for a flexible sigmoidoscopy or every 10 years for a colonoscopy beginning at age 6 years and continuing until age 58 years.  Hepatitis C blood test.** / For all people born from [birth year] through 1965 and any individual with known risks for hepatitis C.  Osteoporosis screening.** / A one-time screening for women ages 15 years and over and women at risk for fractures or osteoporosis.  Skin self-exam. / Monthly.  Influenza vaccine. / Every year.  Tetanus, diphtheria, and acellular pertussis (Tdap/Td) vaccine.** / 1 dose of Td every 10 years.  Varicella vaccine.** / Consult your health care provider.  Zoster vaccine.** / 1 dose for adults aged 49 years or older.  Pneumococcal 13-valent conjugate (PCV13) vaccine.** / Consult your health care provider.  Pneumococcal polysaccharide (PPSV23) vaccine.** / 1 dose for all adults aged 83  years and older.  Meningococcal vaccine.** / Consult your health care provider.  Hepatitis A vaccine.** / Consult your health care provider.  Hepatitis B vaccine.** / Consult your health care provider.  Haemophilus influenzae type b (Hib) vaccine.** / Consult your health care provider. ** Family history and personal history of risk and conditions may change your health care provider's recommendations.   This information is not intended to replace advice given to you by your health care provider. Make sure you discuss any questions you have with your health care provider.   Document Released: 01/20/2002 Document Revised: 12/15/2014 Document Reviewed: 04/21/2011 Elsevier Interactive Patient Education [employer].

## 2016-10-14 NOTE — Progress Notes (Signed)
Subjective:   Donna Mejia is a 35 y.o. G65P0 Caucasian female here for a routine well-woman exam.  Patient's last menstrual period was 09/17/2016.    Current complaints: anxiety worse at times, but feels like on good dose of Prozac, takes it daily. PCP: me       doesn't desire labs  Social History: Sexual: heterosexual Marital Status: married Living situation: with family Occupation: homemaker Tobacco/alcohol: no tobacco use Illicit drugs: no history of illicit drug use  The following portions of the patient's history were reviewed and updated as appropriate: allergies, current medications, past family history, past medical history, past social history, past surgical history and problem list.  Past Medical History Past Medical History:  Diagnosis Date  . Dysmenorrhea   . Hyperthyroidism   . IMB (intermenstrual bleeding)   . PCOD (polycystic ovarian disease)   . Pelvic pain in female     Past Surgical History Past Surgical History:  Procedure Laterality Date  . HYSTEROSCOPY  08/2010   secretory endometrium    Gynecologic History G2P0  Patient's last menstrual period was 09/17/2016. Contraception: vasectomy Last Pap: 2016. Results were: normal   Obstetric History OB History  Gravida Para Term Preterm AB Living  2            SAB TAB Ectopic Multiple Live Births               # Outcome Date GA Lbr Len/2nd Weight Sex Delivery Anes PTL Lv  2 Gravida 2009     Vag-Spont     1 Gravida               Current Medications Current Outpatient Prescriptions on File Prior to Visit  Medication Sig Dispense Refill  . FLUoxetine (PROZAC) 10 MG capsule Take 1 capsule (10 mg total) by mouth daily. 90 capsule 2  . methimazole (TAPAZOLE) 5 MG tablet Take 5 mg by mouth daily.    Marland Kitchen albendazole (ALBENZA) 200 MG tablet Take 2 tablets (400 mg total) by mouth 2 (two) times daily. (Patient not taking: Reported on 10/14/2016) 48 tablet 3   No current facility-administered medications on  file prior to visit.     Review of Systems Patient denies any headaches, blurred vision, shortness of breath, chest pain, abdominal pain, problems with bowel movements, urination, or intercourse.  Objective:  BP 123/72   Pulse 71   Ht 5\' 6"  (1.676 m)   Wt 133 lb 14.4 oz (60.7 kg)   LMP 09/17/2016   BMI 21.61 kg/m  Physical Exam  General:  Well developed, well nourished, no acute distress. She is alert and oriented x3. Skin:  Warm and dry Neck:  Midline trachea, no thyromegaly or nodules Cardiovascular: Regular rate and rhythm, no murmur heard Lungs:  Effort normal, all lung fields clear to auscultation bilaterally Breasts:  No dominant palpable mass, retraction, or nipple discharge Abdomen:  Soft, non tender, no hepatosplenomegaly or masses Pelvic:  External genitalia is normal in appearance.  The vagina is normal in appearance. The cervix is bulbous, no CMT.  Thin prep pap is not done. Uterus is felt to be normal size, shape, and contour.  No adnexal masses or tenderness noted. Extremities:  No swelling or varicosities noted Psych:  She has a normal mood and affect  Assessment:   Healthy well-woman exam Anxiety- on SSRI  Plan:   F/U 1 year for AE, or sooner if needed   Ena Demary Rockney Ghee, CNM

## 2017-01-08 ENCOUNTER — Ambulatory Visit (INDEPENDENT_AMBULATORY_CARE_PROVIDER_SITE_OTHER): Payer: 59 | Admitting: Obstetrics and Gynecology

## 2017-01-08 ENCOUNTER — Encounter: Payer: Self-pay | Admitting: Obstetrics and Gynecology

## 2017-01-08 VITALS — BP 126/78 | HR 92 | Ht 66.0 in | Wt 144.4 lb

## 2017-01-08 DIAGNOSIS — N907 Vulvar cyst: Secondary | ICD-10-CM

## 2017-01-08 DIAGNOSIS — N9089 Other specified noninflammatory disorders of vulva and perineum: Secondary | ICD-10-CM

## 2017-01-08 NOTE — Progress Notes (Signed)
Subjective:     Patient ID: Donna Mejia, female   DOB: [birth year], 36 y.o.   MRN: [id number]  HPI Knot on left of clitoris x 1 month, not painful, changes size, no drainage, did epsom salt soaks and it got smaller  Review of Systems Negative except for stated in HPI    Objective:   Physical Exam A&O x4 Well groomed female in no distress Vitals:   01/08/17 1128  Weight: 144 lb 6.4 oz (65.5 kg)  Height: 5\' 6"  (1.676 m)  Pelvic exam: VULVA: normal appearing vulva with no masses, tenderness or lesions, small 1 x1 mm nodule noted on left side of clitoris-non-tender, mobile, without drainage, VAGINA: normal appearing vagina with normal color and discharge, no lesions.    Assessment:     Clitoral cyst    Plan:     Continue epsom salt soaks as needed. Reassured RTC as needed.  Rhodes Calvert Hugo, CNM

## 2017-10-16 ENCOUNTER — Encounter: Payer: Self-pay | Admitting: Obstetrics and Gynecology

## 2017-10-16 ENCOUNTER — Ambulatory Visit (INDEPENDENT_AMBULATORY_CARE_PROVIDER_SITE_OTHER): Payer: 59 | Admitting: Obstetrics and Gynecology

## 2017-10-16 ENCOUNTER — Other Ambulatory Visit: Payer: Self-pay | Admitting: Obstetrics and Gynecology

## 2017-10-16 VITALS — BP 132/68 | HR 70 | Ht 66.0 in | Wt 144.4 lb

## 2017-10-16 DIAGNOSIS — Z01419 Encounter for gynecological examination (general) (routine) without abnormal findings: Secondary | ICD-10-CM | POA: Diagnosis not present

## 2017-10-16 DIAGNOSIS — Z23 Encounter for immunization: Secondary | ICD-10-CM

## 2017-10-16 MED ORDER — TETANUS-DIPHTH-ACELL PERTUSSIS 5-2.5-18.5 LF-MCG/0.5 IM SUSP
0.5000 mL | Freq: Once | INTRAMUSCULAR | Status: AC
  Administered 2017-10-16: 0.5 mL via INTRAMUSCULAR

## 2017-10-16 NOTE — Progress Notes (Signed)
Subjective:   Donna Mejia is a 36 y.o. G56P0 Caucasian female here for a routine well-woman exam.  Patient's last menstrual period was 09/22/2017.    Current complaints: none PCP: me       does desire labs  Social History: Sexual: heterosexual Marital Status: married Living situation: with family Occupation: homemaker Tobacco/alcohol: no tobacco use Illicit drugs: no history of illicit drug use  The following portions of the patient's history were reviewed and updated as appropriate: allergies, current medications, past family history, past medical history, past social history, past surgical history and problem list.  Past Medical History Past Medical History:  Diagnosis Date  . Dysmenorrhea   . Hyperthyroidism   . IMB (intermenstrual bleeding)   . PCOD (polycystic ovarian disease)   . Pelvic pain in female     Past Surgical History Past Surgical History:  Procedure Laterality Date  . HYSTEROSCOPY  08/2010   secretory endometrium    Gynecologic History G2P0  Patient's last menstrual period was 09/22/2017. Contraception: tubal ligation Last Pap: 2016. Results were: normal   Obstetric History OB History  Gravida Para Term Preterm AB Living  2            SAB TAB Ectopic Multiple Live Births               # Outcome Date GA Lbr Len/2nd Weight Sex Delivery Anes PTL Lv  2 Gravida 2009     Vag-Spont     1 Gravida               Current Medications Current Outpatient Medications on File Prior to Visit  Medication Sig Dispense Refill  . FLUoxetine (PROZAC) 10 MG capsule Take 1 capsule (10 mg total) by mouth daily. 90 capsule 4  . methimazole (TAPAZOLE) 5 MG tablet Take 5 mg by mouth daily.    Marland Kitchen albendazole (ALBENZA) 200 MG tablet Take 2 tablets (400 mg total) by mouth 2 (two) times daily. (Patient not taking: Reported on 10/14/2016) 48 tablet 3   No current facility-administered medications on file prior to visit.     Review of Systems Patient denies any  headaches, blurred vision, shortness of breath, chest pain, abdominal pain, problems with bowel movements, urination, or intercourse.  Objective:  BP 132/68   Pulse 70   Ht 5\' 6"  (1.676 m)   Wt 144 lb 6.4 oz (65.5 kg)   LMP 09/22/2017   BMI 23.31 kg/m  Physical Exam  General:  Well developed, well nourished, no acute distress. She is alert and oriented x3. Skin:  Warm and dry Neck:  Midline trachea, no thyromegaly or nodules Cardiovascular: Regular rate and rhythm, no murmur heard Lungs:  Effort normal, all lung fields clear to auscultation bilaterally Breasts:  No dominant palpable mass, retraction, or nipple discharge Abdomen:  Soft, non tender, no hepatosplenomegaly or masses Pelvic:  External genitalia is normal in appearance.  The vagina is normal in appearance. The cervix is bulbous, no CMT.  Thin prep pap is not done . Uterus is felt to be normal size, shape, and contour.  No adnexal masses or tenderness noted. Extremities:  No swelling or varicosities noted Psych:  She has a normal mood and affect  Assessment:   Healthy well-woman exam Needs flu and Tdap vaccines   Plan:  Labs obtained Flu & Tdap vaccines given today F/U 1 year for AE, or sooner if needed    Evarose Altland Rockney Ghee, CNM

## 2017-10-17 LAB — COMPREHENSIVE METABOLIC PANEL
A/G RATIO: 1.7 (ref 1.2–2.2)
ALBUMIN: 4.6 g/dL (ref 3.5–5.5)
ALT: 10 IU/L (ref 0–32)
AST: 16 IU/L (ref 0–40)
Alkaline Phosphatase: 58 IU/L (ref 39–117)
BUN / CREAT RATIO: 17 (ref 9–23)
BUN: 14 mg/dL (ref 6–20)
CALCIUM: 9.5 mg/dL (ref 8.7–10.2)
CHLORIDE: 105 mmol/L (ref 96–106)
CO2: 25 mmol/L (ref 20–29)
Creatinine, Ser: 0.82 mg/dL (ref 0.57–1.00)
GFR, EST AFRICAN AMERICAN: 106 mL/min/{1.73_m2} (ref >59)
GFR, EST NON AFRICAN AMERICAN: 92 mL/min/{1.73_m2} (ref >59)
GLOBULIN, TOTAL: 2.7 g/dL (ref 1.5–4.5)
Glucose: 82 mg/dL (ref 65–99)
POTASSIUM: 4.4 mmol/L (ref 3.5–5.2)
SODIUM: 143 mmol/L (ref 134–144)
TOTAL PROTEIN: 7.3 g/dL (ref 6.0–8.5)

## 2017-10-17 LAB — LIPID PANEL
CHOLESTEROL TOTAL: 227 mg/dL — AB (ref 100–199)
Chol/HDL Ratio: 4.5 ratio — ABNORMAL HIGH (ref 0.0–4.4)
HDL: 51 mg/dL (ref >39)
LDL Calculated: 116 mg/dL — ABNORMAL HIGH (ref 0–99)
Triglycerides: 302 mg/dL — ABNORMAL HIGH (ref 0–149)
VLDL CHOLESTEROL CAL: 60 mg/dL — AB (ref 5–40)

## 2017-10-22 ENCOUNTER — Telehealth: Payer: Self-pay | Admitting: Obstetrics and Gynecology

## 2017-10-22 NOTE — Telephone Encounter (Signed)
Please call patient with results of labwork..  

## 2017-11-05 NOTE — Telephone Encounter (Signed)
pls advise

## 2017-11-05 NOTE — Telephone Encounter (Signed)
Labs look good, OK to mail her copies.

## 2017-11-06 NOTE — Telephone Encounter (Signed)
Notified pt and copy mailed

## 2017-11-09 ENCOUNTER — Telehealth: Payer: Self-pay | Admitting: *Deleted

## 2017-11-09 NOTE — Telephone Encounter (Signed)
Notified pt. 

## 2017-11-09 NOTE — Telephone Encounter (Signed)
-----   Message from Joylene Igo, [place] sent at 11/06/2017 11:10 PM EST ----- Please let her know labs and that she needs to work on getting her cholesterol levels down

## 2018-10-02 ENCOUNTER — Other Ambulatory Visit: Payer: Self-pay | Admitting: Obstetrics and Gynecology

## 2018-10-21 ENCOUNTER — Encounter: Payer: Self-pay | Admitting: Obstetrics and Gynecology

## 2018-10-21 ENCOUNTER — Ambulatory Visit (INDEPENDENT_AMBULATORY_CARE_PROVIDER_SITE_OTHER): Payer: 59 | Admitting: Obstetrics and Gynecology

## 2018-10-21 ENCOUNTER — Other Ambulatory Visit: Payer: Self-pay | Admitting: Obstetrics and Gynecology

## 2018-10-21 VITALS — BP 109/73 | HR 69 | Ht 66.0 in | Wt 146.1 lb

## 2018-10-21 DIAGNOSIS — Z23 Encounter for immunization: Secondary | ICD-10-CM | POA: Diagnosis not present

## 2018-10-21 DIAGNOSIS — N920 Excessive and frequent menstruation with regular cycle: Secondary | ICD-10-CM | POA: Diagnosis not present

## 2018-10-21 DIAGNOSIS — Z01419 Encounter for gynecological examination (general) (routine) without abnormal findings: Secondary | ICD-10-CM

## 2018-10-21 DIAGNOSIS — E7800 Pure hypercholesterolemia, unspecified: Secondary | ICD-10-CM

## 2018-10-21 DIAGNOSIS — Z1231 Encounter for screening mammogram for malignant neoplasm of breast: Secondary | ICD-10-CM

## 2018-10-21 MED ORDER — MEDROXYPROGESTERONE ACETATE 150 MG/ML IM SUSP
150.0000 mg | Freq: Once | INTRAMUSCULAR | Status: AC
  Administered 2018-10-21: 150 mg via INTRAMUSCULAR

## 2018-10-21 MED ORDER — MEDROXYPROGESTERONE ACETATE 150 MG/ML IM SUSP: 150.0000 mg | INTRAMUSCULAR | 4 refills | Status: DC

## 2018-10-21 NOTE — Progress Notes (Signed)
Subjective:   Donna Mejia is a 37 y.o. G2P0 Caucasian female here for a routine well-woman exam.  Patient's last menstrual period was 10/10/2018.    Current complaints: PMS getting worse three days before menses, and cycles are heavy with clots and heavy flow for first 2-3 days of 7 day cycle. Had a D&C in the past for heavy bleeding. Exercise and eating well help it.  PCP: me       does desire labs  Social History: Sexual: heterosexual Marital Status: married Living situation: with family Occupation: homemaker Tobacco/alcohol: no tobacco use Illicit drugs: no history of illicit drug use  The following portions of the patient's history were reviewed and updated as appropriate: allergies, current medications, past family history, past medical history, past social history, past surgical history and problem list.  Past Medical History Past Medical History:  Diagnosis Date  . Dysmenorrhea   . Hyperthyroidism   . IMB (intermenstrual bleeding)   . PCOD (polycystic ovarian disease)   . Pelvic pain in female     Past Surgical History Past Surgical History:  Procedure Laterality Date  . HYSTEROSCOPY  08/2010   secretory endometrium    Gynecologic History G2P0  Patient's last menstrual period was 10/10/2018. Contraception: tubal ligation Last Pap: 09/2015. Results were: normal   Obstetric History OB History  Gravida Para Term Preterm AB Living  2            SAB TAB Ectopic Multiple Live Births               # Outcome Date GA Lbr Len/2nd Weight Sex Delivery Anes PTL Lv  2 Gravida 2009     Vag-Spont     1 Gravida             Current Medications Current Outpatient Medications on File Prior to Visit  Medication Sig Dispense Refill  . FLUoxetine (PROZAC) 10 MG capsule TAKE 1 CAPSULE (10 MG TOTAL) BY MOUTH DAILY. 90 capsule 2  . methimazole (TAPAZOLE) 5 MG tablet Take 5 mg by mouth daily.    Marland Kitchen albendazole (ALBENZA) 200 MG tablet Take 2 tablets (400 mg total) by mouth 2  (two) times daily. (Patient not taking: Reported on 10/14/2016) 48 tablet 3   No current facility-administered medications on file prior to visit.     Review of Systems Patient denies any headaches, blurred vision, shortness of breath, chest pain, abdominal pain, problems with bowel movements, urination, or intercourse.  Objective:  BP 109/73   Pulse 69   Ht 5\' 6"  (1.676 m)   Wt 146 lb 1.6 oz (66.3 kg)   LMP 10/10/2018   BMI 23.58 kg/m  Physical Exam  General:  Well developed, well nourished, no acute distress. She is alert and oriented x3. Skin:  Warm and dry Neck:  Midline trachea, no thyromegaly or nodules Cardiovascular: Regular rate and rhythm, no murmur heard Lungs:  Effort normal, all lung fields clear to auscultation bilaterally Breasts:  No dominant palpable mass, retraction, or nipple discharge Abdomen:  Soft, non tender, no hepatosplenomegaly or masses Pelvic:  External genitalia is normal in appearance.  The vagina is normal in appearance. The cervix is bulbous, no CMT.  Thin prep pap is not done . Uterus is felt to be normal size, shape, and contour.  No adnexal masses or tenderness noted. Extremities:  No swelling or varicosities noted Psych:  She has a normal mood and affect  Assessment:   Healthy well-woman exam Needs flu vaccine Menorrhagia  PMS Elevated cholesterol  Plan:  Flu vaccine given Will start depo provera today, RTC in 3 months for next shot. Labs obtained-will follow up accordingly F/U 1 year for AE, or sooner if needed Mammogram baseline ordered  Shriley Joffe Rockney Ghee, CNM

## 2018-10-21 NOTE — Patient Instructions (Addendum)
Preventive Care 18-39 Years, Female Preventive care refers to lifestyle choices and visits with your health care provider that can promote health and wellness. What does preventive care include?  A yearly physical exam. This is also called an annual well check.  Dental exams once or twice a year.  Routine eye exams. Ask your health care provider how often you should have your eyes checked.  Personal lifestyle choices, including: ? Daily care of your teeth and gums. ? Regular physical activity. ? Eating a healthy diet. ? Avoiding tobacco and drug use. ? Limiting alcohol use. ? Practicing safe sex. ? Taking vitamin and mineral supplements as recommended by your health care provider. What happens during an annual well check? The services and screenings done by your health care provider during your annual well check will depend on your age, overall health, lifestyle risk factors, and family history of disease. Counseling Your health care provider may ask you questions about your:  Alcohol use.  Tobacco use.  Drug use.  Emotional well-being.  Home and relationship well-being.  Sexual activity.  Eating habits.  Work and work Statistician.  Method of birth control.  Menstrual cycle.  Pregnancy history.  Screening You may have the following tests or measurements:  Height, weight, and BMI.  Diabetes screening. This is done by checking your blood sugar (glucose) after you have not eaten for a while (fasting).  Blood pressure.  Lipid and cholesterol levels. These may be checked every 5 years starting at age 38.  Skin check.  Hepatitis C blood test.  Hepatitis B blood test.  Sexually transmitted disease (STD) testing.  BRCA-related cancer screening. This may be done if you have a family history of breast, ovarian, tubal, or peritoneal cancers.  Pelvic exam and Pap test. This may be done every 3 years starting at age 38. Starting at age 30, this may be done  every 5 years if you have a Pap test in combination with an HPV test.  Discuss your test results, treatment options, and if necessary, the need for more tests with your health care provider. Vaccines Your health care provider may recommend certain vaccines, such as:  Influenza vaccine. This is recommended every year.  Tetanus, diphtheria, and acellular pertussis (Tdap, Td) vaccine. You may need a Td booster every 10 years.  Varicella vaccine. You may need this if you have not been vaccinated.  HPV vaccine. If you are 39 or younger, you may need three doses over 6 months.  Measles, mumps, and rubella (MMR) vaccine. You may need at least one dose of MMR. You may also need a second dose.  Pneumococcal 13-valent conjugate (PCV13) vaccine. You may need this if you have certain conditions and were not previously vaccinated.  Pneumococcal polysaccharide (PPSV23) vaccine. You may need one or two doses if you smoke cigarettes or if you have certain conditions.  Meningococcal vaccine. One dose is recommended if you are age 68-21 years and a first-year college student living in a residence hall, or if you have one of several medical conditions. You may also need additional booster doses.  Hepatitis A vaccine. You may need this if you have certain conditions or if you travel or work in places where you may be exposed to hepatitis A.  Hepatitis B vaccine. You may need this if you have certain conditions or if you travel or work in places where you may be exposed to hepatitis B.  Haemophilus influenzae type b (Hib) vaccine. You may need this  if you have certain risk factors.  Talk to your health care provider about which screenings and vaccines you need and how often you need them. This information is not intended to replace advice given to you by your health care provider. Make sure you discuss any questions you have with your health care provider. Document Released: 01/20/2002 Document Revised:  08/13/2016 Document Reviewed: 09/25/2015 Elsevier Interactive Patient Education  2018 [employer].  Fat and Cholesterol Restricted Diet Getting too much fat and cholesterol in your diet may cause health problems. Following this diet helps keep your fat and cholesterol at normal levels. This can keep you from getting sick. What types of fat should I choose?  Choose monosaturated and polyunsaturated fats. These are found in foods such as olive oil, canola oil, flaxseeds, walnuts, almonds, and seeds.  Eat more omega-3 fats. Good choices include salmon, mackerel, sardines, tuna, flaxseed oil, and ground flaxseeds.  Limit saturated fats. These are in animal products such as meats, butter, and cream. They can also be in plant products such as palm oil, palm kernel oil, and coconut oil.  Avoid foods with partially hydrogenated oils in them. These contain trans fats. Examples of foods that have trans fats are stick margarine, some tub margarines, cookies, crackers, and other baked goods. What general guidelines do I need to follow?  Check food labels. Look for the words "trans fat" and "saturated fat."  When preparing a meal: ? Fill half of your plate with vegetables and green salads. ? Fill one fourth of your plate with whole grains. Look for the word "whole" as the first word in the ingredient list. ? Fill one fourth of your plate with lean protein foods.  Eat more foods that have fiber, like apples, carrots, beans, peas, and barley.  Eat more home-cooked foods. Eat less at restaurants and buffets.  Limit or avoid alcohol.  Limit foods high in starch and sugar.  Limit fried foods.  Cook foods without frying them. Baking, boiling, grilling, and broiling are all great options.  Lose weight if you are overweight. Losing even a small amount of weight can help your overall health. It can also help prevent diseases such as diabetes and heart disease. What foods can I eat? Grains Whole  grains, such as whole wheat or whole grain breads, crackers, cereals, and pasta. Unsweetened oatmeal, bulgur, barley, quinoa, or brown rice. Corn or whole wheat flour tortillas. Vegetables Fresh or frozen vegetables (raw, steamed, roasted, or grilled). Green salads. Fruits All fresh, canned (in natural juice), or frozen fruits. Meat and Other Protein Products Ground beef (85% or leaner), grass-fed beef, or beef trimmed of fat. Skinless chicken or [place]. Ground chicken or [place]. Pork trimmed of fat. All fish and seafood. Eggs. Dried beans, peas, or lentils. Unsalted nuts or seeds. Unsalted canned or dry beans. Dairy Low-fat dairy products, such as skim or 1% milk, 2% or reduced-fat cheeses, low-fat ricotta or cottage cheese, or plain low-fat yogurt. Fats and Oils Tub margarines without trans fats. Light or reduced-fat mayonnaise and salad dressings. Avocado. Olive, canola, sesame, or safflower oils. Natural peanut or almond butter (choose ones without added sugar and oil). The items listed above may not be a complete list of recommended foods or beverages. Contact your dietitian for more options. What foods are not recommended? Grains White bread. White pasta. White rice. Cornbread. Bagels, pastries, and croissants. Crackers that contain trans fat. Vegetables White potatoes. Corn. Creamed or fried vegetables. Vegetables in a cheese sauce. Fruits Dried  fruits. Canned fruit in light or heavy syrup. Fruit juice. Meat and Other Protein Products Fatty cuts of meat. Ribs, chicken wings, bacon, sausage, bologna, salami, chitterlings, fatback, hot dogs, bratwurst, and packaged luncheon meats. Liver and organ meats. Dairy Whole or 2% milk, cream, half-and-half, and cream cheese. Whole milk cheeses. Whole-fat or sweetened yogurt. Full-fat cheeses. Nondairy creamers and whipped toppings. Processed cheese, cheese spreads, or cheese curds. Sweets and Desserts Corn syrup, sugars, honey, and molasses.  Candy. Jam and jelly. Syrup. Sweetened cereals. Cookies, pies, cakes, donuts, muffins, and ice cream. Fats and Oils Butter, stick margarine, lard, shortening, ghee, or bacon fat. Coconut, palm kernel, or palm oils. Beverages Alcohol. Sweetened drinks (such as sodas, lemonade, and fruit drinks or punches). The items listed above may not be a complete list of foods and beverages to avoid. Contact your dietitian for more information. This information is not intended to replace advice given to you by your health care provider. Make sure you discuss any questions you have with your health care provider. Document Released: 05/25/2012 Document Revised: 07/31/2016 Document Reviewed: 02/23/2014 Elsevier Interactive Patient Education  2018 [employer].  Cholesterol Cholesterol is a white, waxy, fat-like substance that is needed by the human body in small amounts. The liver makes all the cholesterol we need. Cholesterol is carried from the liver by the blood through the blood vessels. Deposits of cholesterol (plaques) may build up on blood vessel (artery) walls. Plaques make the arteries narrower and stiffer. Cholesterol plaques increase the risk for heart attack and stroke. You cannot feel your cholesterol level even if it is very high. The only way to know that it is high is to have a blood test. Once you know your cholesterol levels, you should keep a record of the test results. Work with your health care provider to keep your levels in the desired range. What do the results mean?  Total cholesterol is a rough measure of all the cholesterol in your blood.  LDL (low-density lipoprotein) is the "bad" cholesterol. This is the type that causes plaque to build up on the artery walls. You want this level to be low.  HDL (high-density lipoprotein) is the "good" cholesterol because it cleans the arteries and carries the LDL away. You want this level to be high.  Triglycerides are fat that the body can either  burn for energy or store. High levels are closely linked to heart disease. What are the desired levels of cholesterol?  Total cholesterol below 200.  LDL below 100 for people who are at risk, below 70 for people at very high risk.  HDL above 40 is good. A level of 60 or higher is considered to be protective against heart disease.  Triglycerides below 150. How can I lower my cholesterol? Diet Follow your diet program as told by your health care provider.  Choose fish or white meat chicken and [place], roasted or baked. Limit fatty cuts of red meat, fried foods, and processed meats, such as sausage and lunch meats.  Eat lots of fresh fruits and vegetables.  Choose whole grains, beans, pasta, potatoes, and cereals.  Choose olive oil, corn oil, or canola oil, and use only small amounts.  Avoid butter, mayonnaise, shortening, or palm kernel oils.  Avoid foods with trans fats.  Drink skim or nonfat milk and eat low-fat or nonfat yogurt and cheeses. Avoid whole milk, cream, ice cream, egg yolks, and full-fat cheeses.  Healthier desserts include angel food cake, ginger snaps, animal crackers, hard candy,  popsicles, and low-fat or nonfat frozen yogurt. Avoid pastries, cakes, pies, and cookies.  Exercise  Follow your exercise program as told by your health care provider. A regular program: ? Helps to decrease LDL and raise HDL. ? Helps with weight control.  Do things that increase your activity level, such as gardening, walking, and taking the stairs.  Ask your health care provider about ways that you can be more active in your daily life.  Medicine  Take over-the-counter and prescription medicines only as told by your health care provider. ? Medicine may be prescribed by your health care provider to help lower cholesterol and decrease the risk for heart disease. This is usually done if diet and exercise have failed to bring down cholesterol levels. ? If you have several risk  factors, you may need medicine even if your levels are normal.  This information is not intended to replace advice given to you by your health care provider. Make sure you discuss any questions you have with your health care provider. Document Released: 08/19/2001 Document Revised: 06/21/2016 Document Reviewed: 05/24/2016 Elsevier Interactive Patient Education  [employer].

## 2018-10-21 NOTE — Addendum Note (Signed)
Addended by: Keturah Barre L on: 10/21/2018 11:14 AM   Modules accepted: Orders

## 2018-10-22 ENCOUNTER — Telehealth: Payer: Self-pay | Admitting: *Deleted

## 2018-10-22 LAB — LIPID PANEL
CHOLESTEROL TOTAL: 258 mg/dL — AB (ref 100–199)
Chol/HDL Ratio: 4.7 ratio — ABNORMAL HIGH (ref 0.0–4.4)
HDL: 55 mg/dL (ref >39)
LDL CALC: 176 mg/dL — AB (ref 0–99)
TRIGLYCERIDES: 137 mg/dL (ref 0–149)
VLDL CHOLESTEROL CAL: 27 mg/dL (ref 5–40)

## 2018-10-22 LAB — CBC
HEMATOCRIT: 38.6 % (ref 34.0–46.6)
Hemoglobin: 12.9 g/dL (ref 11.1–15.9)
MCH: 29.3 pg (ref 26.6–33.0)
MCHC: 33.4 g/dL (ref 31.5–35.7)
MCV: 88 fL (ref 79–97)
Platelets: 215 10*3/uL (ref 150–450)
RBC: 4.41 x10E6/uL (ref 3.77–5.28)
RDW: 12.4 % (ref 12.3–15.4)
WBC: 5.6 10*3/uL (ref 3.4–10.8)

## 2018-10-22 LAB — COMPREHENSIVE METABOLIC PANEL
A/G RATIO: 1.7 (ref 1.2–2.2)
ALK PHOS: 60 IU/L (ref 39–117)
ALT: 12 IU/L (ref 0–32)
AST: 16 IU/L (ref 0–40)
Albumin: 4.7 g/dL (ref 3.5–5.5)
BILIRUBIN TOTAL: 0.4 mg/dL (ref 0.0–1.2)
BUN/Creatinine Ratio: 15 (ref 9–23)
BUN: 14 mg/dL (ref 6–20)
CHLORIDE: 101 mmol/L (ref 96–106)
CO2: 24 mmol/L (ref 20–29)
Calcium: 9.9 mg/dL (ref 8.7–10.2)
Creatinine, Ser: 0.92 mg/dL (ref 0.57–1.00)
GFR calc Af Amer: 91 mL/min/{1.73_m2} (ref >59)
GFR calc non Af Amer: 79 mL/min/{1.73_m2} (ref >59)
GLOBULIN, TOTAL: 2.7 g/dL (ref 1.5–4.5)
Glucose: 98 mg/dL (ref 65–99)
Potassium: 4.3 mmol/L (ref 3.5–5.2)
Sodium: 141 mmol/L (ref 134–144)
Total Protein: 7.4 g/dL (ref 6.0–8.5)

## 2018-10-22 LAB — B12 AND FOLATE PANEL
FOLATE: 19.4 ng/mL (ref >3.0)
VITAMIN B 12: 827 pg/mL (ref 232–1245)

## 2018-10-22 LAB — FERRITIN: Ferritin: 29 ng/mL (ref 15–150)

## 2018-10-22 LAB — VITAMIN D 25 HYDROXY (VIT D DEFICIENCY, FRACTURES): Vit D, 25-Hydroxy: 35.8 ng/mL (ref 30.0–100.0)

## 2018-10-22 NOTE — Telephone Encounter (Signed)
Mailed all info to pt 

## 2018-10-22 NOTE — Telephone Encounter (Signed)
-----   Message from Joylene Igo, [place] sent at 10/22/2018 11:02 AM EST ----- Please let her know her cholesterol has continued going up. Please reduce daily intake of high cholesterol foods and increase exercise. I want to recheck in 3 months and if it isn't down some may need to consider medication. Have her also check out the American Heart Associations website as they have great info on this

## 2018-11-01 ENCOUNTER — Encounter: Payer: Self-pay | Admitting: Radiology

## 2018-11-01 ENCOUNTER — Ambulatory Visit
Admission: RE | Admit: 2018-11-01 | Discharge: 2018-11-01 | Disposition: A | Discharge status: Home / Self Care | Payer: 59 | Source: Ambulatory Visit | Attending: Obstetrics and Gynecology | Admitting: Obstetrics and Gynecology

## 2018-11-01 ENCOUNTER — Other Ambulatory Visit: Payer: Self-pay | Admitting: Obstetrics and Gynecology

## 2018-11-01 DIAGNOSIS — N6489 Other specified disorders of breast: Secondary | ICD-10-CM

## 2018-11-01 DIAGNOSIS — R928 Other abnormal and inconclusive findings on diagnostic imaging of breast: Secondary | ICD-10-CM

## 2018-11-01 DIAGNOSIS — Z1231 Encounter for screening mammogram for malignant neoplasm of breast: Secondary | ICD-10-CM | POA: Insufficient documentation

## 2018-11-12 ENCOUNTER — Ambulatory Visit
Admission: RE | Admit: 2018-11-12 | Discharge: 2018-11-12 | Disposition: A | Discharge status: Home / Self Care | Payer: 59 | Source: Ambulatory Visit | Attending: Obstetrics and Gynecology | Admitting: Obstetrics and Gynecology

## 2018-11-12 DIAGNOSIS — N6489 Other specified disorders of breast: Secondary | ICD-10-CM

## 2018-11-12 DIAGNOSIS — R928 Other abnormal and inconclusive findings on diagnostic imaging of breast: Secondary | ICD-10-CM | POA: Insufficient documentation

## 2018-11-15 ENCOUNTER — Other Ambulatory Visit: Payer: Self-pay | Admitting: Obstetrics and Gynecology

## 2018-11-15 ENCOUNTER — Telehealth: Payer: Self-pay | Admitting: Obstetrics and Gynecology

## 2018-11-15 DIAGNOSIS — R928 Other abnormal and inconclusive findings on diagnostic imaging of breast: Secondary | ICD-10-CM

## 2018-11-19 ENCOUNTER — Ambulatory Visit
Admission: RE | Admit: 2018-11-19 | Discharge: 2018-11-19 | Disposition: A | Discharge status: Home / Self Care | Payer: 59 | Source: Ambulatory Visit | Attending: Obstetrics and Gynecology | Admitting: Obstetrics and Gynecology

## 2018-11-19 DIAGNOSIS — R928 Other abnormal and inconclusive findings on diagnostic imaging of breast: Secondary | ICD-10-CM | POA: Diagnosis present

## 2018-11-19 HISTORY — PX: BREAST BIOPSY: SHX20

## 2018-11-22 LAB — SURGICAL PATHOLOGY

## 2018-11-23 ENCOUNTER — Telehealth: Payer: Self-pay | Admitting: Obstetrics and Gynecology

## 2018-11-23 NOTE — Telephone Encounter (Signed)
Patient called with questions regarding depo and if it affects anything due to her biopsy results. She would like a call back.Thanks

## 2018-11-24 ENCOUNTER — Other Ambulatory Visit: Payer: Self-pay | Admitting: Obstetrics and Gynecology

## 2018-11-24 DIAGNOSIS — R928 Other abnormal and inconclusive findings on diagnostic imaging of breast: Secondary | ICD-10-CM

## 2018-11-25 NOTE — Telephone Encounter (Signed)
Mel does depo have anything to do with her having to do breast biopsy??

## 2018-11-26 ENCOUNTER — Ambulatory Visit: Payer: Self-pay | Admitting: General Surgery

## 2018-11-26 NOTE — H&P (View-Only) (Signed)
PATIENT PROFILE: Donna Mejia is a 37 y.o. female who presents to the Clinic for consultation at the request of Dr. Trudee Kuster for evaluation of left breast discordant mass.  PCP:  None  HISTORY OF PRESENT ILLNESS: Donna Mejia reports went to her regular yearly physical exam and the PCP ordered the first screening mammogram (11/01/18). She was found to have a suspicious lesion that needed more views. She had diagnostic mammogram 11/12/18 and stereotactic core biopsy on 11/19/18. 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 This was found discordant with images. Patient denies any breast pain, skin changes, nipple retraction or discharge.   Family history of breast cancer: none Family history of other cancers: none Menarche: 11-13 years  Menopause: n/a (last menstrual period 11/21/18) Used OCP: yes Used estrogen and progesterone therapy: none  History of Radiation to the chest: none No previous biopsy  PROBLEM LIST:         Problem List  Date Reviewed: 02/03/2017         Noted   Palpitation 07/28/2016   Atypical chest pain 07/28/2016   Anxiety 05/16/2016   Subclinical hyperthyroidism, unspecified 10/12/2014   Acute chest pain, unspecified 10/12/2014      GENERAL REVIEW OF SYSTEMS:   General ROS: negative for - chills, fatigue, fever, weight gain or weight loss Allergy and Immunology ROS: negative for - hives  Hematological and Lymphatic ROS: negative for - bleeding problems or bruising, negative for palpable nodes Endocrine ROS: negative for - heat or cold intolerance, hair changes Respiratory ROS: negative for - cough, shortness of breath or wheezing Cardiovascular ROS: no chest pain or palpitations GI ROS: negative for nausea, vomiting, abdominal pain, diarrhea, constipation Musculoskeletal ROS: negative for - joint swelling or muscle pain Neurological ROS: negative for - confusion,  syncope Dermatological ROS: negative for pruritus and rash Psychiatric: negative for anxiety, depression, difficulty sleeping and memory loss  MEDICATIONS: CurrentMedications        Current Outpatient Medications  Medication Sig Dispense Refill  . FLUoxetine (PROZAC) 10 MG capsule Take 10 mg by mouth once daily      . medroxyprogesterone acetate (DEPO-PROVERA IM) Inject into the muscle every 3 (three) months    . methIMAzole (TAPAZOLE) 5 MG tablet Take 0.5 tablets (2.5 mg total) by mouth once daily 45 tablet 3   No current facility-administered medications for this visit.       ALLERGIES: Levaquin [levofloxacin] and Penicillin v potassium  PAST MEDICAL HISTORY:     Past Medical History:  Diagnosis Date  . Anemia   . Headache   . History of chicken pox   . PCOS (polycystic ovarian syndrome)   . Subclinical hyperthyroidism, unspecified     PAST SURGICAL HISTORY:      Past Surgical History:  Procedure Laterality Date  . DILATION AND CURETTAGE, DIAGNOSTIC / THERAPEUTIC  08/2010     FAMILY HISTORY:      Family History  Problem Relation Age of Onset  . High blood pressure (Hypertension) Mother   . Hyperlipidemia (Elevated cholesterol) Mother   . Coronary Artery Disease (Blocked arteries around heart) Father   . High blood pressure (Hypertension) Father   . Hyperlipidemia (Elevated cholesterol) Father   . High blood pressure (Hypertension) Sister   . High blood pressure (Hypertension) Brother   . Hyperlipidemia (Elevated cholesterol) Brother      SOCIAL HISTORY: Social History          Socioeconomic History  .  Marital status: Married    Spouse name: Not on file  . Number of children: Not on file  . Years of education: Not on file  . Highest education level: Not on file  Occupational History  . Not on file  Social Needs  . Financial resource strain: Not on file  . Food insecurity:    Worry: Not on file    Inability: Not on  file  . Transportation needs:    Medical: Not on file    Non-medical: Not on file  Tobacco Use  . Smoking status: Never Smoker  . Smokeless tobacco: Never Used  Substance and Sexual Activity  . Alcohol use: No  . Drug use: No  . Sexual activity: Defer  Other Topics Concern  . Not on file  Social History Narrative  . Not on file      PHYSICAL EXAM:    Vitals:   11/26/18 0810  BP: 119/75  Pulse: 98  Temp: 36.1 C (97 F)   Body mass index is 23.63 kg/m. Weight: 64.4 kg (142 lb)   GENERAL: Alert, active, oriented x3  HEENT: Pupils equal reactive to light. Extraocular movements are intact. Sclera clear. Palpebral conjunctiva normal red color.Pharynx clear.  NECK: Supple with no palpable mass and no adenopathy.  LUNGS: Sound clear with no rales rhonchi or wheezes.  HEART: Regular rhythm S1 and S2 without murmur.  BREAST: Bialteral breast evaluated on sitting and supine position. Both breasts appear normal, no suspicious masses, no skin or nipple changes or axillary nodes.  ABDOMEN: Soft and depressible, nontender with no palpable mass, no hepatomegaly.  EXTREMITIES: Well-developed well-nourished symmetrical with no dependent edema.  NEUROLOGICAL: Awake alert oriented, facial expression symmetrical, moving all extremities.  REVIEW OF DATA: I have reviewed the following data today:      No visits with results within 3 Month(s) from this visit.  Latest known visit with results is:  Appointment on 01/12/2018  Component Date Value  . Thyroid Stimulating Horm* 01/12/2018 1.003   . Thyroxine, Free (FT4) 01/12/2018 0.70     CBC and CMP found in care everywhere done on 10/21/18 are normal.   ASSESSMENT: Donna Mejia is a 37 y.o. female presenting for consultation for left breast discordant pathology and images.    Patient was oriented again about the pathology results. Due to the discordant of the mammogram the and pathology results showing PASH,  excisional biopsy is recommended. A needle guided excisional biopsy will need to be done since the mass is non palpable. Surgical technique and post operative care was discussed with patient. Risk of surgery was discussed with patient including but not limited to: wound infection, seroma, hematoma, mondor's disease (thrombosis of small veins of breast), chronic wound pain, breast lymphedema, altered sensation to the nipple and cosmesis among others.   PLAN: 1. Needle guided excisional biopsy of left breast mass (19125) 2. CBC, CMP done on 10/21/18 normal 3. Avoid aspirin 5 days before surgery 4. Contact us if has any question or concern.   Patient and her husband verbalized understanding, all questions were answered, and were agreeable with the plan outlined above.  Herbert Pun, MD  Electronically signed by Herbert Pun, MD

## 2018-11-26 NOTE — H&P (Signed)
PATIENT PROFILE: Donna Mejia is a 37 y.o. female who presents to the Clinic for consultation at the request of Dr. Trudee Kuster for evaluation of left breast discordant mass.  PCP:  None  HISTORY OF PRESENT ILLNESS: Donna Mejia reports went to her regular yearly physical exam and the PCP ordered the first screening mammogram (11/01/18). She was found to have a suspicious lesion that needed more views. She had diagnostic mammogram 11/12/18 and stereotactic core biopsy on 11/19/18. 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 This was found discordant with images. Patient denies any breast pain, skin changes, nipple retraction or discharge.   Family history of breast cancer: none Family history of other cancers: none Menarche: [age] years  Menopause: n/a (last menstrual period 11/21/18) Used OCP: yes Used estrogen and progesterone therapy: none  History of Radiation to the chest: none No previous biopsy  PROBLEM LIST:         Problem List  Date Reviewed: 02/03/2017         Noted   Palpitation 07/28/2016   Atypical chest pain 07/28/2016   Anxiety 05/16/2016   Subclinical hyperthyroidism, unspecified 10/12/2014   Acute chest pain, unspecified 10/12/2014      GENERAL REVIEW OF SYSTEMS:   General ROS: negative for - chills, fatigue, fever, weight gain or weight loss Allergy and Immunology ROS: negative for - hives  Hematological and Lymphatic ROS: negative for - bleeding problems or bruising, negative for palpable nodes Endocrine ROS: negative for - heat or cold intolerance, hair changes Respiratory ROS: negative for - cough, shortness of breath or wheezing Cardiovascular ROS: no chest pain or palpitations GI ROS: negative for nausea, vomiting, abdominal pain, diarrhea, constipation Musculoskeletal ROS: negative for - joint swelling or muscle pain Neurological ROS: negative for - confusion,  syncope Dermatological ROS: negative for pruritus and rash Psychiatric: negative for anxiety, depression, difficulty sleeping and memory loss  MEDICATIONS: CurrentMedications        Current Outpatient Medications  Medication Sig Dispense Refill  . FLUoxetine (PROZAC) 10 MG capsule Take 10 mg by mouth once daily      . medroxyprogesterone acetate (DEPO-PROVERA IM) Inject into the muscle every 3 (three) months    . methIMAzole (TAPAZOLE) 5 MG tablet Take 0.5 tablets (2.5 mg total) by mouth once daily 45 tablet 3   No current facility-administered medications for this visit.       ALLERGIES: Levaquin [levofloxacin] and Penicillin v potassium  PAST MEDICAL HISTORY:     Past Medical History:  Diagnosis Date  . Anemia   . Headache   . History of chicken pox   . PCOS (polycystic ovarian syndrome)   . Subclinical hyperthyroidism, unspecified     PAST SURGICAL HISTORY:      Past Surgical History:  Procedure Laterality Date  . DILATION AND CURETTAGE, DIAGNOSTIC / THERAPEUTIC  08/2010     FAMILY HISTORY:      Family History  Problem Relation Age of Onset  . High blood pressure (Hypertension) Mother   . Hyperlipidemia (Elevated cholesterol) Mother   . Coronary Artery Disease (Blocked arteries around heart) Father   . High blood pressure (Hypertension) Father   . Hyperlipidemia (Elevated cholesterol) Father   . High blood pressure (Hypertension) Sister   . High blood pressure (Hypertension) Brother   . Hyperlipidemia (Elevated cholesterol) Brother      SOCIAL HISTORY: Social History          Socioeconomic History  .  Marital status: Married    Spouse name: Not on file  . Number of children: Not on file  . Years of education: Not on file  . Highest education level: Not on file  Occupational History  . Not on file  Social Needs  . Financial resource strain: Not on file  . Food insecurity:    Worry: Not on file    Inability: Not on  file  . Transportation needs:    Medical: Not on file    Non-medical: Not on file  Tobacco Use  . Smoking status: Never Smoker  . Smokeless tobacco: Never Used  Substance and Sexual Activity  . Alcohol use: No  . Drug use: No  . Sexual activity: Defer  Other Topics Concern  . Not on file  Social History Narrative  . Not on file      PHYSICAL EXAM:    Vitals:   11/26/18 0810  BP: 119/75  Pulse: 98  Temp: 36.1 C (97 F)   Body mass index is 23.63 kg/m. Weight: 64.4 kg (142 lb)   GENERAL: Alert, active, oriented x3  HEENT: Pupils equal reactive to light. Extraocular movements are intact. Sclera clear. Palpebral conjunctiva normal red color.Pharynx clear.  NECK: Supple with no palpable mass and no adenopathy.  LUNGS: Sound clear with no rales rhonchi or wheezes.  HEART: Regular rhythm S1 and S2 without murmur.  BREAST: Bialteral breast evaluated on sitting and supine position. Both breasts appear normal, no suspicious masses, no skin or nipple changes or axillary nodes.  ABDOMEN: Soft and depressible, nontender with no palpable mass, no hepatomegaly.  EXTREMITIES: Well-developed well-nourished symmetrical with no dependent edema.  NEUROLOGICAL: Awake alert oriented, facial expression symmetrical, moving all extremities.  REVIEW OF DATA: I have reviewed the following data today:      No visits with results within 3 Month(s) from this visit.  Latest known visit with results is:  Appointment on 01/12/2018  Component Date Value  . Thyroid Stimulating Horm* 01/12/2018 1.003   . Thyroxine, Free (FT4) 01/12/2018 0.70     CBC and CMP found in care everywhere done on 10/21/18 are normal.   ASSESSMENT: Donna Mejia is a 37 y.o. female presenting for consultation for left breast discordant pathology and images.    Patient was oriented again about the pathology results. Due to the discordant of the mammogram the and pathology results showing PASH,  excisional biopsy is recommended. A needle guided excisional biopsy will need to be done since the mass is non palpable. Surgical technique and post operative care was discussed with patient. Risk of surgery was discussed with patient including but not limited to: wound infection, seroma, hematoma, mondor's disease (thrombosis of small veins of breast), chronic wound pain, breast lymphedema, altered sensation to the nipple and cosmesis among others.   PLAN: 1. Needle guided excisional biopsy of left breast mass (19125) 2. CBC, CMP done on 10/21/18 normal 3. Avoid aspirin 5 days before surgery 4. Contact us if has any question or concern.   Patient and her husband verbalized understanding, all questions were answered, and were agreeable with the plan outlined above.  Herbert Pun, MD  Electronically signed by Herbert Pun, MD

## 2018-11-29 ENCOUNTER — Encounter
Admission: RE | Admit: 2018-11-29 | Discharge: 2018-11-29 | Disposition: A | Discharge status: Home / Self Care | Payer: 59 | Source: Ambulatory Visit | Attending: General Surgery | Admitting: General Surgery

## 2018-11-29 ENCOUNTER — Other Ambulatory Visit: Payer: Self-pay | Admitting: General Surgery

## 2018-11-29 ENCOUNTER — Other Ambulatory Visit: Payer: Self-pay

## 2018-11-29 DIAGNOSIS — R928 Other abnormal and inconclusive findings on diagnostic imaging of breast: Secondary | ICD-10-CM

## 2018-11-29 NOTE — Patient Instructions (Signed)
Your procedure is scheduled on: 12/03/18 Report to Mammography. [place] 0900   Remember: Instructions that are not followed completely may result in serious medical risk,  up to and including death, or upon the discretion of your surgeon and anesthesiologist your  surgery may need to be rescheduled.     _X__ 1. Do not eat food after midnight the night before your procedure.                 No gum chewing or hard candies. You may drink clear liquids up to 2 hours                 before you are scheduled to arrive for your surgery- DO not drink clear                 liquids within 2 hours of the start of your surgery.                 Clear Liquids include:  water, apple juice without pulp, clear carbohydrate                 drink such as Clearfast of Gatorade, Black Coffee or Tea (Do not add                 anything to coffee or tea).  __X__2.  On the morning of surgery brush your teeth with toothpaste and water, you                may rinse your mouth with mouthwash if you wish.  Do not swallow any toothpaste of mouthwash.     _X__ 3.  No Alcohol for 24 hours before or after surgery.   _X__ 4.  Do Not Smoke or use e-cigarettes For 24 Hours Prior to Your Surgery.                 Do not use any chewable tobacco products for at least 6 hours prior to                 surgery.  ____  5.  Bring all medications with you on the day of surgery if instructed.   _X___  6.  Notify your doctor if there is any change in your medical condition      (cold, fever, infections).     Do not wear jewelry, make-up, hairpins, clips or nail polish. Do not wear lotions, powders, or perfumes. You may wear deodorant. Do not shave 48 hours prior to surgery. Men may shave face and neck. Do not bring valuables to the hospital.    [facility] is not responsible for any belongings or valuables.  Contacts, dentures or bridgework may not be worn into surgery. Leave your  suitcase in the car. After surgery it may be brought to your room. For patients admitted to the hospital, discharge time is determined by your treatment team.   Patients discharged the day of surgery will not be allowed to drive home.   __X__ Take these medicines the morning of surgery with A SIP OF WATER:    1. FLUOXETINE  2. METHIMAZOLE  3.   4.  5.  6.  ____ Fleet Enema (as directed)   ____ Use CHG Soap as directed  ____ Use inhalers on the day of surgery  ____ Stop metformin 2 days prior to surgery    ____ Take 1/2 of usual insulin dose the night before surgery. No insulin the morning  of surgery.   ____ Stop Coumadin/Plavix/aspirin on     ____ Stop Anti-inflammatories on  ALREADY STOPPED   ____ Stop supplements until after surgery.    ____ Bring C-Pap to the hospital.

## 2018-11-30 NOTE — Telephone Encounter (Signed)
Error

## 2018-11-30 NOTE — Telephone Encounter (Signed)
Not at all.   Donna Mejia

## 2018-12-02 ENCOUNTER — Encounter: Payer: Self-pay | Admitting: Anesthesiology

## 2018-12-02 MED ORDER — CLINDAMYCIN PHOSPHATE 900 MG/50ML IV SOLN
900.0000 mg | INTRAVENOUS | Status: AC
  Administered 2018-12-03: 900 mg via INTRAVENOUS

## 2018-12-03 ENCOUNTER — Ambulatory Visit: Payer: 59 | Admitting: Anesthesiology

## 2018-12-03 ENCOUNTER — Encounter
Admission: RE | Disposition: A | Discharge status: Home / Self Care | Payer: Self-pay | Source: Home / Self Care | Attending: General Surgery

## 2018-12-03 ENCOUNTER — Encounter: Payer: Self-pay | Admitting: *Deleted

## 2018-12-03 ENCOUNTER — Ambulatory Visit
Admission: RE | Admit: 2018-12-03 | Discharge: 2018-12-03 | Disposition: A | Discharge status: Home / Self Care | Payer: 59 | Source: Ambulatory Visit | Attending: General Surgery | Admitting: General Surgery

## 2018-12-03 ENCOUNTER — Ambulatory Visit
Admission: RE | Admit: 2018-12-03 | Discharge: 2018-12-03 | Disposition: A | Discharge status: Home / Self Care | Payer: 59 | Attending: General Surgery | Admitting: General Surgery

## 2018-12-03 ENCOUNTER — Other Ambulatory Visit: Payer: Self-pay

## 2018-12-03 DIAGNOSIS — N6489 Other specified disorders of breast: Secondary | ICD-10-CM | POA: Insufficient documentation

## 2018-12-03 DIAGNOSIS — F419 Anxiety disorder, unspecified: Secondary | ICD-10-CM | POA: Diagnosis not present

## 2018-12-03 DIAGNOSIS — R928 Other abnormal and inconclusive findings on diagnostic imaging of breast: Secondary | ICD-10-CM

## 2018-12-03 DIAGNOSIS — N6320 Unspecified lump in the left breast, unspecified quadrant: Secondary | ICD-10-CM | POA: Diagnosis present

## 2018-12-03 HISTORY — PX: BREAST BIOPSY: SHX20

## 2018-12-03 HISTORY — PX: BREAST EXCISIONAL BIOPSY: SUR124

## 2018-12-03 LAB — POCT PREGNANCY, URINE: Preg Test, Ur: NEGATIVE

## 2018-12-03 SURGERY — BREAST BIOPSY WITH NEEDLE LOCALIZATION
Anesthesia: General | Site: Breast | Laterality: Left

## 2018-12-03 MED ORDER — DEXAMETHASONE SODIUM PHOSPHATE 10 MG/ML IJ SOLN
INTRAMUSCULAR | Status: AC
  Filled 2018-12-03: qty 1

## 2018-12-03 MED ORDER — DEXAMETHASONE SODIUM PHOSPHATE 10 MG/ML IJ SOLN
INTRAMUSCULAR | Status: DC | PRN
  Administered 2018-12-03: 8 mg via INTRAVENOUS

## 2018-12-03 MED ORDER — GLYCOPYRROLATE 0.2 MG/ML IJ SOLN
INTRAMUSCULAR | Status: DC | PRN
  Administered 2018-12-03: 0.2 mg via INTRAVENOUS

## 2018-12-03 MED ORDER — PHENYLEPHRINE HCL 10 MG/ML IJ SOLN
INTRAMUSCULAR | Status: DC | PRN
  Administered 2018-12-03: 80 ug via INTRAVENOUS

## 2018-12-03 MED ORDER — LIDOCAINE HCL (CARDIAC) PF 100 MG/5ML IV SOSY
PREFILLED_SYRINGE | INTRAVENOUS | Status: DC | PRN
  Administered 2018-12-03: 60 mg via INTRAVENOUS

## 2018-12-03 MED ORDER — FAMOTIDINE 20 MG PO TABS: 20.0000 mg | ORAL_TABLET | Freq: Once | ORAL | Status: DC

## 2018-12-03 MED ORDER — ONDANSETRON HCL 4 MG/2ML IJ SOLN
INTRAMUSCULAR | Status: AC
  Filled 2018-12-03: qty 2

## 2018-12-03 MED ORDER — ONDANSETRON HCL 4 MG/2ML IJ SOLN
INTRAMUSCULAR | Status: DC | PRN
  Administered 2018-12-03: 4 mg via INTRAVENOUS

## 2018-12-03 MED ORDER — LACTATED RINGERS IV SOLN
INTRAVENOUS | Status: DC
  Administered 2018-12-03: 11:00:00 via INTRAVENOUS

## 2018-12-03 MED ORDER — FENTANYL CITRATE (PF) 100 MCG/2ML IJ SOLN
INTRAMUSCULAR | Status: AC
  Filled 2018-12-03: qty 2

## 2018-12-03 MED ORDER — PROMETHAZINE HCL 25 MG/ML IJ SOLN
INTRAMUSCULAR | Status: AC
  Filled 2018-12-03: qty 1

## 2018-12-03 MED ORDER — HYDROCODONE-ACETAMINOPHEN 5-325 MG PO TABS: 1.0000 | ORAL_TABLET | ORAL | 0 refills | Status: AC | PRN

## 2018-12-03 MED ORDER — FENTANYL CITRATE (PF) 100 MCG/2ML IJ SOLN
INTRAMUSCULAR | Status: DC | PRN
  Administered 2018-12-03 (×8): 25 ug via INTRAVENOUS

## 2018-12-03 MED ORDER — MIDAZOLAM HCL 2 MG/2ML IJ SOLN
INTRAMUSCULAR | Status: AC
  Filled 2018-12-03: qty 2

## 2018-12-03 MED ORDER — PROPOFOL 10 MG/ML IV BOLUS
INTRAVENOUS | Status: DC | PRN
  Administered 2018-12-03: 200 mg via INTRAVENOUS

## 2018-12-03 MED ORDER — MIDAZOLAM HCL 2 MG/2ML IJ SOLN
INTRAMUSCULAR | Status: DC | PRN
  Administered 2018-12-03: 2 mg via INTRAVENOUS

## 2018-12-03 MED ORDER — SODIUM CHLORIDE FLUSH 0.9 % IV SOLN
INTRAVENOUS | Status: AC
  Filled 2018-12-03: qty 10

## 2018-12-03 MED ORDER — PROMETHAZINE HCL 25 MG/ML IJ SOLN
6.2500 mg | Freq: Once | INTRAMUSCULAR | Status: AC
  Administered 2018-12-03: 6.25 mg via INTRAVENOUS

## 2018-12-03 MED ORDER — BUPIVACAINE-EPINEPHRINE (PF) 0.5% -1:200000 IJ SOLN
INTRAMUSCULAR | Status: DC | PRN
  Administered 2018-12-03: 14 mL

## 2018-12-03 MED ORDER — FENTANYL CITRATE (PF) 100 MCG/2ML IJ SOLN: 25.0000 ug | INTRAMUSCULAR | Status: DC | PRN

## 2018-12-03 MED ORDER — ONDANSETRON HCL 4 MG/2ML IJ SOLN
4.0000 mg | Freq: Once | INTRAMUSCULAR | Status: AC | PRN
  Administered 2018-12-03: 4 mg via INTRAVENOUS

## 2018-12-03 SURGICAL SUPPLY — 32 items
CANISTER SUCT 1200ML W/VALVE (MISCELLANEOUS) ×3 IMPLANT
CHLORAPREP W/TINT 26ML (MISCELLANEOUS) ×3 IMPLANT
CNTNR SPEC 2.5X3XGRAD LEK (MISCELLANEOUS) ×1
CONT SPEC 4OZ STER OR WHT (MISCELLANEOUS) ×2
CONTAINER SPEC 2.5X3XGRAD LEK (MISCELLANEOUS) ×1 IMPLANT
COVER PROBE FLX POLY STRL (MISCELLANEOUS) ×3 IMPLANT
COVER WAND RF STERILE (DRAPES) ×3 IMPLANT
DERMABOND ADVANCED (GAUZE/BANDAGES/DRESSINGS) ×2
DERMABOND ADVANCED .7 DNX12 (GAUZE/BANDAGES/DRESSINGS) ×1 IMPLANT
DEVICE DUBIN SPECIMEN MAMMOGRA (MISCELLANEOUS) ×3 IMPLANT
DRAPE LAPAROTOMY 77X122 PED (DRAPES) ×3 IMPLANT
ELECT REM PT RETURN 9FT ADLT (ELECTROSURGICAL) ×3
ELECTRODE REM PT RTRN 9FT ADLT (ELECTROSURGICAL) ×1 IMPLANT
GLOVE BIO SURGEON STRL SZ 6.5 (GLOVE) ×2 IMPLANT
GLOVE BIO SURGEONS STRL SZ 6.5 (GLOVE) ×1
GOWN STRL REUS W/ TWL LRG LVL3 (GOWN DISPOSABLE) ×3 IMPLANT
GOWN STRL REUS W/TWL LRG LVL3 (GOWN DISPOSABLE) ×6
KIT TURNOVER KIT A (KITS) ×3 IMPLANT
LABEL OR SOLS (LABEL) ×3 IMPLANT
MARGIN MAP 10MM (MISCELLANEOUS) ×3 IMPLANT
NEEDLE HYPO 25X1 1.5 SAFETY (NEEDLE) ×6 IMPLANT
PACK BASIN MINOR ARMC (MISCELLANEOUS) ×3 IMPLANT
SUT ETHILON 3-0 FS-10 30 BLK (SUTURE) ×3
SUT MNCRL 4-0 (SUTURE) ×2
SUT MNCRL 4-0 27XMFL (SUTURE) ×1
SUT SILK 2 0 SH (SUTURE) ×3 IMPLANT
SUT VIC AB 3-0 SH 27 (SUTURE) ×2
SUT VIC AB 3-0 SH 27X BRD (SUTURE) ×1 IMPLANT
SUTURE EHLN 3-0 FS-10 30 BLK (SUTURE) ×1 IMPLANT
SUTURE MNCRL 4-0 27XMF (SUTURE) ×1 IMPLANT
SYR 10ML LL (SYRINGE) ×3 IMPLANT
WATER STERILE IRR 1000ML POUR (IV SOLUTION) ×3 IMPLANT

## 2018-12-03 NOTE — Interval H&P Note (Signed)
History and Physical Interval Note:  12/03/2018 10:51 AM  Donna Mejia  has presented today for surgery, with the diagnosis of PSEUDOANGIOMATOUS STROMAL HYPERPLASIA OF BREAST  The various methods of treatment have been discussed with the patient and family. After consideration of risks, benefits and other options for treatment, the patient has consented to  Procedure(s): BREAST BIOPSY WITH NEEDLE LOCALIZATION (Left) as a surgical intervention .  The patient's history has been reviewed, patient examined, no change in status, stable for surgery.  I have reviewed the patient's chart and labs.  Left breast marked in the pre procedure room.  Questions were answered to the patient's satisfaction.     Herbert Pun

## 2018-12-03 NOTE — Transfer of Care (Signed)
Immediate Anesthesia Transfer of Care Note  Patient: Donna Mejia  Procedure(s) Performed: BREAST BIOPSY WITH NEEDLE LOCALIZATION (Left Breast)  Patient Location: PACU  Anesthesia Type:General  Level of Consciousness: sedated  Airway & Oxygen Therapy: Patient Spontanous Breathing and Patient connected to face mask oxygen  Post-op Assessment: Report given to RN and Post -op Vital signs reviewed and stable  Post vital signs: Reviewed and stable  Last Vitals:  Vitals Value Taken Time  BP 100/57 12/03/2018  1:31 PM  Temp 36.7 C 12/03/2018  1:30 PM  Pulse 87 12/03/2018  1:31 PM  Resp 11 12/03/2018  1:31 PM  SpO2 99 % 12/03/2018  1:31 PM  Vitals shown include unvalidated device data.  Last Pain:  Vitals:   12/03/18 1330  TempSrc:   PainSc: 0-No pain         Complications: No apparent anesthesia complications

## 2018-12-03 NOTE — Anesthesia Post-op Follow-up Note (Signed)
Anesthesia QCDR form completed.        

## 2018-12-03 NOTE — Anesthesia Procedure Notes (Signed)
Procedure Name: LMA Insertion Date/Time: 12/03/2018 11:17 AM Performed by: Hedda Slade, CRNA Pre-anesthesia Checklist: Patient identified, Patient being monitored, Timeout performed, Emergency Drugs available and Suction available Patient Re-evaluated:Patient Re-evaluated prior to induction Oxygen Delivery Method: Circle system utilized Preoxygenation: Pre-oxygenation with 100% oxygen Induction Type: IV induction Ventilation: Mask ventilation without difficulty LMA: LMA inserted LMA Size: 3.5 Tube type: Oral Number of attempts: 1 Placement Confirmation: positive ETCO2 and breath sounds checked- equal and bilateral Tube secured with: Tape Dental Injury: Teeth and Oropharynx as per pre-operative assessment

## 2018-12-03 NOTE — Anesthesia Preprocedure Evaluation (Signed)
Anesthesia Evaluation  Patient identified by MRN, date of birth, ID band Patient awake    Reviewed: Allergy & Precautions, NPO status , Patient's Chart, lab work & pertinent test results, reviewed documented beta blocker date and time   Airway Mallampati: II  TM Distance: >3 FB     Dental  (+) Chipped   Pulmonary           Cardiovascular      Neuro/Psych Anxiety    GI/Hepatic   Endo/Other    Renal/GU      Musculoskeletal   Abdominal   Peds  Hematology   Anesthesia Other Findings PCOD,  Reproductive/Obstetrics                             Anesthesia Physical Anesthesia Plan  ASA: II  Anesthesia Plan: General   Post-op Pain Management:    Induction: Intravenous  PONV Risk Score and Plan:   Airway Management Planned: LMA  Additional Equipment:   Intra-op Plan:   Post-operative Plan:   Informed Consent: I have reviewed the patients History and Physical, chart, labs and discussed the procedure including the risks, benefits and alternatives for the proposed anesthesia with the patient or authorized representative who has indicated his/her understanding and acceptance.     Plan Discussed with: CRNA  Anesthesia Plan Comments:         Anesthesia Quick Evaluation

## 2018-12-03 NOTE — Anesthesia Postprocedure Evaluation (Signed)
Anesthesia Post Note  Patient: Donna Mejia  Procedure(s) Performed: BREAST BIOPSY WITH NEEDLE LOCALIZATION (Left Breast)  Patient location during evaluation: PACU Anesthesia Type: General Level of consciousness: awake and alert Pain management: pain level controlled Vital Signs Assessment: post-procedure vital signs reviewed and stable Respiratory status: spontaneous breathing, nonlabored ventilation, respiratory function stable and patient connected to nasal cannula oxygen Cardiovascular status: blood pressure returned to baseline and stable Postop Assessment: no apparent nausea or vomiting Anesthetic complications: no     Last Vitals:  Vitals:   12/03/18 1432 12/03/18 1515  BP: 127/81 122/84  Pulse: 100 98  Resp: 14 14  Temp: 37.1 C 36.8 C  SpO2: 100% 100%    Last Pain:  Vitals:   12/03/18 1515  TempSrc:   PainSc: 0-No pain                 Eufemia Prindle S

## 2018-12-03 NOTE — Op Note (Signed)
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   Procedure: Left needle-localized breast excisional biopsy.                       Anesthesia: GETA  Surgeon: Dr. Windell Moment  Wound Classification: Clean  Indications: Patient is a 37 y.o. female with a nonpalpable left breast mass noted on mammography with core biopsy demonstrating pseudoangiomatous stromal hyperplasia that was not concordant with images and requires needle-localized excisional biopsy for confirmation.   Findings: 1. Specimen mammography shows distortion area. Marker not included on specimen since it was migrated 2. Pathology call refers there is abundant fibrosis on area and unable to evaluate gross margin 3. No other palpable mass or lymph node identified.   Description of procedure: Preoperative needle localization was performed by radiology. Localization studies were reviewed. The patient was taken to the operating room and placed supine on the operating table, and after general anesthesia the left chest was prepped and draped in the usual sterile fashion. A time-out was completed verifying correct patient, procedure, site, positioning, and implant(s) and/or special equipment prior to beginning this procedure.  By comparing the localization studies with the direction and skin entry site of the needle, the probable trajectory and location of the mass was visualized. It was a difficult approach the wire getting in from the inferior breast margin for a lesion on the upper outer quadrant. A circumareolar skin incision was planned in such a way as to minimize the amount of dissection to reach the mass.  The skin incision was made. Flaps were raised and the location of the wire confirmed. The wire was delivered into the wound. A 2-0 silk figure-of-eight stay suture was placed around  the wire and used for retraction. Dissection was then taken down circumferentially, taking care to include the entire localizing needle and a wide margin of grossly normal tissue. The specimen and entire localizing wire were removed. The specimen was oriented and sent to radiology with the localization studies. Radiology reported seen some distortion on the specimen mammogram. Pathology refers that grossly was difficult to evaluate the mass due to abundant fibrosis. This most probably being Mart that is a fibrotic lesion, low risk for surgery, it was decided not to do any more excision. The wound was irrigated. Hemostasis was checked. The wound was closed with interrupted sutures of 3-0 Vicryl and a subcuticular suture of Monocryl 4-0. No attempt was made to close the dead space. A dressing was applied.  The patient tolerated the procedure well and was taken to the postanesthesia care unit in stable condition.   Specimen: Left Breast mass (Orientation markers used: Caudal, Medial, Deep)              Complications: None  Estimated Blood Loss: 30mL

## 2018-12-03 NOTE — Discharge Instructions (Addendum)
  Diet: Resume home heart healthy regular diet.   Activity: No heavy lifting >20 pounds (children, pets, laundry, garbage) or strenuous activity until follow-up, but light activity and walking are encouraged. Do not drive or drink alcohol if taking narcotic pain medications.  Wound care: May shower with soapy water and pat dry (do not rub incisions), but no baths or submerging incision underwater until follow-up. (no swimming)   Medications: Resume all home medications. For mild to moderate pain: acetaminophen (Tylenol) or ibuprofen (if no kidney disease). Combining Tylenol with alcohol can substantially increase your risk of causing liver disease. Narcotic pain medications, if prescribed, can be used for severe pain, though may cause nausea, constipation, and drowsiness. Do not combine Tylenol and Norco within a 6 hour period as Norco contains Tylenol. If you do not need the narcotic pain medication, you do not need to fill the prescription.  Call office ([phone]) at any time if any questions, worsening pain, fevers/chills, bleeding, drainage from incision site, or other concerns.   AMBULATORY SURGERY  DISCHARGE INSTRUCTIONS   1) The drugs that you were given will stay in your system until tomorrow so for the next 24 hours you should not:  A) Drive an automobile B) Make any legal decisions C) Drink any alcoholic beverage   2) You may resume regular meals tomorrow.  Today it is better to start with liquids and gradually work up to solid foods.  You may eat anything you prefer, but it is better to start with liquids, then soup and crackers, and gradually work up to solid foods.   3) Please notify your doctor immediately if you have any unusual bleeding, trouble breathing, redness and pain at the surgery site, drainage, fever, or pain not relieved by medication.    4) Additional Instructions:        Please contact your physician with any problems or Same Day Surgery at  [phone], Monday through Friday 6 am to 4 pm, or [place] at  Main number at [phone]. 

## 2018-12-04 ENCOUNTER — Encounter: Payer: Self-pay | Admitting: General Surgery

## 2018-12-06 LAB — SURGICAL PATHOLOGY

## 2019-01-20 ENCOUNTER — Telehealth: Payer: Self-pay | Admitting: Obstetrics and Gynecology

## 2019-01-20 NOTE — Telephone Encounter (Signed)
Patient called with questions regarding Depo and her abnormal mammo. She didn't know if the Depo can affect the results. Thanks

## 2019-02-01 ENCOUNTER — Other Ambulatory Visit: Payer: 59

## 2019-02-07 ENCOUNTER — Ambulatory Visit: Payer: 59 | Admitting: Obstetrics and Gynecology

## 2019-02-07 ENCOUNTER — Ambulatory Visit: Payer: 59

## 2019-02-07 VITALS — BP 136/77 | HR 65 | Ht 66.0 in | Wt 150.5 lb

## 2019-02-07 DIAGNOSIS — Z3042 Encounter for surveillance of injectable contraceptive: Secondary | ICD-10-CM

## 2019-02-07 MED ORDER — MEDROXYPROGESTERONE ACETATE 150 MG/ML IM SUSP
150.0000 mg | Freq: Once | INTRAMUSCULAR | Status: AC
  Administered 2019-02-07: 150 mg via INTRAMUSCULAR

## 2019-02-07 NOTE — Telephone Encounter (Signed)
Notified pt there is no relation

## 2019-02-07 NOTE — Progress Notes (Unsigned)
Date last pap: NA Last Depo-Provera: 10/21/2018 Side Effects if any: Weight Loss. Serum HCG indicated yes, negative. Depo-Provera 150 mg IM given by: J. [facility] CMA Next appointment due May 18th - June 1st. .  UPT done today was negative.   Today's Vitals   02/07/19 1105  BP: 136/77  Pulse: 65  Weight: 150 lb 8 oz (68.3 kg)  Height: 5\' 6"  (1.676 m)   Body mass index is 24.29 kg/m.

## 2019-03-01 ENCOUNTER — Other Ambulatory Visit: Payer: 59

## 2019-04-22 ENCOUNTER — Telehealth: Payer: Self-pay

## 2019-04-22 NOTE — Telephone Encounter (Signed)
Coronavirus (COVID-19) Are you at risk?  Are you at risk for the Coronavirus (COVID-19)?  To be considered HIGH RISK for Coronavirus (COVID-19), you have to meet the following criteria:  . Traveled to [place], [place], [place], [place] or [place]; or in the [place] to [place], [place], [place], or [place]; and have fever, cough, and shortness of breath within the last 2 weeks of travel OR . Been in close contact with a person diagnosed with COVID-19 within the last 2 weeks and have fever, cough, and shortness of breath . IF YOU DO NOT MEET THESE CRITERIA, YOU ARE CONSIDERED LOW RISK FOR COVID-19.  What to do if you are HIGH RISK for COVID-19?  . If you are having a medical emergency, call 911. . Seek medical care right away. Before you go to a doctor's office, urgent care or emergency department, call ahead and tell them about your recent travel, contact with someone diagnosed with COVID-19, and your symptoms. You should receive instructions from your physician's office regarding next steps of care.  . When you arrive at healthcare provider, tell the healthcare staff immediately you have returned from visiting [place], [place], [place], [place] or [place]; or traveled in the [place] to [place], [place], [place], or [place]; in the last two weeks or you have been in close contact with a person diagnosed with COVID-19 in the last 2 weeks.   . Tell the health care staff about your symptoms: fever, cough and shortness of breath. . After you have been seen by a medical provider, you will be either: o Tested for (COVID-19) and discharged home on quarantine except to seek medical care if symptoms worsen, and asked to  - Stay home and avoid contact with others until you get your results (4-5 days)  - Avoid travel on public transportation if possible (such as bus, train, or airplane) or o Sent to the Emergency Department by EMS for evaluation, COVID-19 testing, and possible  admission depending on your condition and test results.  What to do if you are LOW RISK for COVID-19?  Reduce your risk of any infection by using the same precautions used for avoiding the common cold or flu:  . Wash your hands often with soap and warm water for at least 20 seconds.  If soap and water are not readily available, use an alcohol-based hand sanitizer with at least 60% alcohol.  . If coughing or sneezing, cover your mouth and nose by coughing or sneezing into the elbow areas of your shirt or coat, into a tissue or into your sleeve (not your hands). . Avoid shaking hands with others and consider head nods or verbal greetings only. . Avoid touching your eyes, nose, or mouth with unwashed hands.  . Avoid close contact with people who are sick. . Avoid places or events with large numbers of people in one location, like concerts or sporting events. . Carefully consider travel plans you have or are making. . If you are planning any travel outside or inside the [place], visit the CDC's Travelers' Health webpage for the latest health notices. . If you have some symptoms but not all symptoms, continue to monitor at home and seek medical attention if your symptoms worsen. . If you are having a medical emergency, call 911.   ADDITIONAL HEALTHCARE OPTIONS FOR PATIENTS  [facility] / e-Visit: [email or web address]         [facility]: [phone]  [place]   Urgent Care: [facility]: [phone]   Prescreened neg. cm

## 2019-04-25 ENCOUNTER — Other Ambulatory Visit: Payer: Self-pay

## 2019-04-25 ENCOUNTER — Ambulatory Visit (INDEPENDENT_AMBULATORY_CARE_PROVIDER_SITE_OTHER): Payer: 59 | Admitting: Obstetrics and Gynecology

## 2019-04-25 ENCOUNTER — Other Ambulatory Visit: Payer: 59

## 2019-04-25 VITALS — BP 111/79 | HR 89 | Ht 66.0 in | Wt 149.2 lb

## 2019-04-25 DIAGNOSIS — Z3042 Encounter for surveillance of injectable contraceptive: Secondary | ICD-10-CM | POA: Diagnosis not present

## 2019-04-25 MED ORDER — MEDROXYPROGESTERONE ACETATE 150 MG/ML IM SUSP
150.0000 mg | Freq: Once | INTRAMUSCULAR | Status: AC
  Administered 2019-04-25: 150 mg via INTRAMUSCULAR

## 2019-04-25 NOTE — Progress Notes (Signed)
Date last pap: 09/2014. Last Depo-Provera: 02/25/2019. Side Effects if any: NA Serum HCG indicated? NA. Depo-Provera 150 mg IM given by: J. [facility] CMA Next appointment due 07/11/19 -07/25/19.   Vitals:   04/25/19 1040  BP: 111/79  Pulse: 89

## 2019-06-14 ENCOUNTER — Telehealth: Payer: Self-pay

## 2019-06-14 NOTE — Telephone Encounter (Signed)
Coronavirus (COVID-19) Are you at risk?  Are you at risk for the Coronavirus (COVID-19)?  To be considered HIGH RISK for Coronavirus (COVID-19), you have to meet the following criteria:  . Traveled to [place], [place], [place], [place] or [place]; or in the [place] to [place], [place], [place], or [place]; and have fever, cough, and shortness of breath within the last 2 weeks of travel OR . Been in close contact with a person diagnosed with COVID-19 within the last 2 weeks and have fever, cough, and shortness of breath . IF YOU DO NOT MEET THESE CRITERIA, YOU ARE CONSIDERED LOW RISK FOR COVID-19.  What to do if you are HIGH RISK for COVID-19?  . If you are having a medical emergency, call 911. . Seek medical care right away. Before you go to a doctor's office, urgent care or emergency department, call ahead and tell them about your recent travel, contact with someone diagnosed with COVID-19, and your symptoms. You should receive instructions from your physician's office regarding next steps of care.  . When you arrive at healthcare provider, tell the healthcare staff immediately you have returned from visiting [place], [place], [place], [place] or [place]; or traveled in the [place] to [place], [place], [place], or [place]; in the last two weeks or you have been in close contact with a person diagnosed with COVID-19 in the last 2 weeks.   . Tell the health care staff about your symptoms: fever, cough and shortness of breath. . After you have been seen by a medical provider, you will be either: o Tested for (COVID-19) and discharged home on quarantine except to seek medical care if symptoms worsen, and asked to  - Stay home and avoid contact with others until you get your results (4-5 days)  - Avoid travel on public transportation if possible (such as bus, train, or airplane) or o Sent to the Emergency Department by EMS for evaluation, COVID-19 testing, and possible  admission depending on your condition and test results.  What to do if you are LOW RISK for COVID-19?  Reduce your risk of any infection by using the same precautions used for avoiding the common cold or flu:  . Wash your hands often with soap and warm water for at least 20 seconds.  If soap and water are not readily available, use an alcohol-based hand sanitizer with at least 60% alcohol.  . If coughing or sneezing, cover your mouth and nose by coughing or sneezing into the elbow areas of your shirt or coat, into a tissue or into your sleeve (not your hands). . Avoid shaking hands with others and consider head nods or verbal greetings only. . Avoid touching your eyes, nose, or mouth with unwashed hands.  . Avoid close contact with people who are sick. . Avoid places or events with large numbers of people in one location, like concerts or sporting events. . Carefully consider travel plans you have or are making. . If you are planning any travel outside or inside the [place], visit the CDC's Travelers' Health webpage for the latest health notices. . If you have some symptoms but not all symptoms, continue to monitor at home and seek medical attention if your symptoms worsen. . If you are having a medical emergency, call 911.   ADDITIONAL HEALTHCARE OPTIONS FOR PATIENTS  [facility] / e-Visit: [email or web address]         [facility]: [phone]  [facility]: [phone]                   [facility]: [phone]   Prescreened. Neg .cm 

## 2019-06-15 ENCOUNTER — Other Ambulatory Visit: Payer: 59

## 2019-06-15 ENCOUNTER — Other Ambulatory Visit: Payer: Self-pay

## 2019-06-15 DIAGNOSIS — E7800 Pure hypercholesterolemia, unspecified: Secondary | ICD-10-CM

## 2019-06-16 LAB — LIPID PANEL
Chol/HDL Ratio: 4.7 ratio — ABNORMAL HIGH (ref 0.0–4.4)
Cholesterol, Total: 198 mg/dL (ref 100–199)
HDL: 42 mg/dL (ref >39)
LDL Calculated: 133 mg/dL — ABNORMAL HIGH (ref 0–99)
Triglycerides: 113 mg/dL (ref 0–149)
VLDL Cholesterol Cal: 23 mg/dL (ref 5–40)

## 2019-07-08 ENCOUNTER — Other Ambulatory Visit: Payer: Self-pay | Admitting: Obstetrics and Gynecology

## 2019-07-11 ENCOUNTER — Other Ambulatory Visit: Payer: Self-pay

## 2019-07-11 ENCOUNTER — Ambulatory Visit: Payer: 59 | Admitting: Obstetrics and Gynecology

## 2019-07-11 VITALS — BP 121/72 | HR 72 | Ht 66.0 in | Wt 149.4 lb

## 2019-07-11 DIAGNOSIS — Z3042 Encounter for surveillance of injectable contraceptive: Secondary | ICD-10-CM

## 2019-07-11 MED ORDER — MEDROXYPROGESTERONE ACETATE 150 MG/ML IM SUSP
150.0000 mg | Freq: Once | INTRAMUSCULAR | Status: AC
  Administered 2019-07-11: 150 mg via INTRAMUSCULAR

## 2019-07-11 NOTE — Progress Notes (Unsigned)
Date last pap: na Last Depo-Provera: 04/25/19. Side Effects if any: na Serum HCG indicated? na Depo-Provera 150 mg IM given by: Keturah Barre, CMA Next appointment due 09/26/19-,10/10/19 BP 121/72   Pulse 72   Ht 5\' 6"  (1.676 m)   Wt 149 lb 6.4 oz (67.8 kg)   BMI 24.11 kg/m

## 2019-09-28 ENCOUNTER — Ambulatory Visit: Payer: 59

## 2019-09-29 ENCOUNTER — Other Ambulatory Visit: Payer: Self-pay

## 2019-09-29 ENCOUNTER — Ambulatory Visit: Payer: 59 | Admitting: Obstetrics and Gynecology

## 2019-09-29 VITALS — BP 125/84 | HR 84 | Ht 66.0 in | Wt 150.2 lb

## 2019-09-29 DIAGNOSIS — Z3042 Encounter for surveillance of injectable contraceptive: Secondary | ICD-10-CM

## 2019-09-29 DIAGNOSIS — Z23 Encounter for immunization: Secondary | ICD-10-CM

## 2019-09-29 MED ORDER — MEDROXYPROGESTERONE ACETATE 150 MG/ML IM SUSP
150.0000 mg | Freq: Once | INTRAMUSCULAR | Status: AC
  Administered 2019-09-29: 150 mg via INTRAMUSCULAR

## 2019-09-29 NOTE — Progress Notes (Unsigned)
Date last pap: 10/02/15 Last Depo-Provera: 07/11/19. Side Effects if any: NONE. Serum HCG indicated? n/a. Depo-Provera 150 mg IM given by: FHampton, LPN. Next appointment due Jan 7-21, 2021.  Flu vaccine given.    BP 125/84   Pulse 84   Ht 5\' 6"  (1.676 m)   Wt 150 lb 3.2 oz (68.1 kg)   BMI 24.24 kg/m

## 2019-10-26 ENCOUNTER — Encounter: Payer: 59 | Admitting: Obstetrics and Gynecology

## 2019-11-09 ENCOUNTER — Other Ambulatory Visit: Payer: Self-pay

## 2019-11-09 ENCOUNTER — Ambulatory Visit (INDEPENDENT_AMBULATORY_CARE_PROVIDER_SITE_OTHER): Payer: 59 | Admitting: Certified Nurse Midwife

## 2019-11-09 ENCOUNTER — Encounter: Payer: Self-pay | Admitting: Certified Nurse Midwife

## 2019-11-09 VITALS — BP 122/87 | HR 69 | Ht 66.0 in | Wt 147.4 lb

## 2019-11-09 DIAGNOSIS — Z1231 Encounter for screening mammogram for malignant neoplasm of breast: Secondary | ICD-10-CM

## 2019-11-09 DIAGNOSIS — Z01419 Encounter for gynecological examination (general) (routine) without abnormal findings: Secondary | ICD-10-CM | POA: Diagnosis not present

## 2019-11-09 MED ORDER — MEDROXYPROGESTERONE ACETATE 150 MG/ML IM SUSP: 150.0000 mg | INTRAMUSCULAR | 4 refills | Status: DC

## 2019-11-09 MED ORDER — FLUOXETINE HCL 10 MG PO CAPS: 10.0000 mg | ORAL_CAPSULE | Freq: Every day | ORAL | 2 refills | Status: DC

## 2019-11-09 NOTE — Patient Instructions (Signed)
Preventive Care 21-39 Years Old, Female Preventive care refers to visits with your health care provider and lifestyle choices that can promote health and wellness. This includes:  A yearly physical exam. This may also be called an annual well check.  Regular dental visits and eye exams.  Immunizations.  Screening for certain conditions.  Healthy lifestyle choices, such as eating a healthy diet, getting regular exercise, not using drugs or products that contain nicotine and tobacco, and limiting alcohol use. What can I expect for my preventive care visit? Physical exam Your health care provider will check your:  Height and weight. This may be used to calculate body mass index (BMI), which tells if you are at a healthy weight.  Heart rate and blood pressure.  Skin for abnormal spots. Counseling Your health care provider may ask you questions about your:  Alcohol, tobacco, and drug use.  Emotional well-being.  Home and relationship well-being.  Sexual activity.  Eating habits.  Work and work environment.  Method of birth control.  Menstrual cycle.  Pregnancy history. What immunizations do I need?  Influenza (flu) vaccine  This is recommended every year. Tetanus, diphtheria, and pertussis (Tdap) vaccine  You may need a Td booster every 10 years. Varicella (chickenpox) vaccine  You may need this if you have not been vaccinated. Human papillomavirus (HPV) vaccine  If recommended by your health care provider, you may need three doses over 6 months. Measles, mumps, and rubella (MMR) vaccine  You may need at least one dose of MMR. You may also need a second dose. Meningococcal conjugate (MenACWY) vaccine  One dose is recommended if you are age 19-21 years and a first-year college student living in a residence hall, or if you have one of several medical conditions. You may also need additional booster doses. Pneumococcal conjugate (PCV13) vaccine  You may need  this if you have certain conditions and were not previously vaccinated. Pneumococcal polysaccharide (PPSV23) vaccine  You may need one or two doses if you smoke cigarettes or if you have certain conditions. Hepatitis A vaccine  You may need this if you have certain conditions or if you travel or work in places where you may be exposed to hepatitis A. Hepatitis B vaccine  You may need this if you have certain conditions or if you travel or work in places where you may be exposed to hepatitis B. Haemophilus influenzae type b (Hib) vaccine  You may need this if you have certain conditions. You may receive vaccines as individual doses or as more than one vaccine together in one shot (combination vaccines). Talk with your health care provider about the risks and benefits of combination vaccines. What tests do I need?  Blood tests  Lipid and cholesterol levels. These may be checked every 5 years starting at age 20.  Hepatitis C test.  Hepatitis B test. Screening  Diabetes screening. This is done by checking your blood sugar (glucose) after you have not eaten for a while (fasting).  Sexually transmitted disease (STD) testing.  BRCA-related cancer screening. This may be done if you have a family history of breast, ovarian, tubal, or peritoneal cancers.  Pelvic exam and Pap test. This may be done every 3 years starting at age 21. Starting at age 30, this may be done every 5 years if you have a Pap test in combination with an HPV test. Talk with your health care provider about your test results, treatment options, and if necessary, the need for more tests.   Follow these instructions at home: Eating and drinking   Eat a diet that includes fresh fruits and vegetables, whole grains, lean protein, and low-fat dairy.  Take vitamin and mineral supplements as recommended by your health care provider.  Do not drink alcohol if: ? Your health care provider tells you not to drink. ? You are  pregnant, may be pregnant, or are planning to become pregnant.  If you drink alcohol: ? Limit how much you have to 0-1 drink a day. ? Be aware of how much alcohol is in your drink. In the [place] one drink equals one 12 oz bottle of beer (355 mL), one 5 oz glass of wine (148 mL), or one 1 oz glass of hard liquor (44 mL). Lifestyle  Take daily care of your teeth and gums.  Stay active. Exercise for at least 30 minutes on 5 or more days each week.  Do not use any products that contain nicotine or tobacco, such as cigarettes, e-cigarettes, and chewing tobacco. If you need help quitting, ask your health care provider.  If you are sexually active, practice safe sex. Use a condom or other form of birth control (contraception) in order to prevent pregnancy and STIs (sexually transmitted infections). If you plan to become pregnant, see your health care provider for a preconception visit. What's next?  Visit your health care provider once a year for a well check visit.  Ask your health care provider how often you should have your eyes and teeth checked.  Stay up to date on all vaccines. This information is not intended to replace advice given to you by your health care provider. Make sure you discuss any questions you have with your health care provider. Document Released: 01/20/2002 Document Revised: 08/05/2018 Document Reviewed: 08/05/2018 Elsevier Patient Education  2020 Elsevier Inc.  

## 2019-11-09 NOTE — Progress Notes (Signed)
GYNECOLOGY ANNUAL PREVENTATIVE CARE ENCOUNTER NOTE  History:     Donna Mejia is a 38 y.o. G2P0 female here for a routine annual gynecologic exam.  Current complaints: None. She is struggling with relationship with her mother in law. "She states she hates her and this has been going on for 15 yrs. Mother in law say she is the puzzle piece that does not fit in her family"  Denies abnormal vaginal bleeding, discharge, pelvic pain, problems with intercourse or other gynecologic concerns.     Social History: Sexual: heterosexual Marital Status: married Living situation: with family Occupation: homemaker Tobacco/alcohol: no tobacco use Illicit drugs: no history of illicit drug use  Gynecologic History No LMP recorded. Patient has had an injection. Contraception: Depo-Provera injections, help control painful periods Last Pap: 09/2015. Results were: normal with negative HPV Last mammogram: 12/03/18. Results were: Status post excision of the left breast. The wire tip is present in the specimen. The X shaped post biopsy marker is not present, however this is expected, as the marker had been noted to have displaced inferiorly from the area of distortion.   Obstetric History OB History  Gravida Para Term Preterm AB Living  2            SAB TAB Ectopic Multiple Live Births               # Outcome Date GA Lbr Len/2nd Weight Sex Delivery Anes PTL Lv  2 Gravida 2009     Vag-Spont     1 [place]             Past Medical History:  Diagnosis Date  . Dysmenorrhea   . Hyperthyroidism   . IMB (intermenstrual bleeding)   . PCOD (polycystic ovarian disease)   . Pelvic pain in female     Past Surgical History:  Procedure Laterality Date  . BREAST BIOPSY Left 11/19/2018   Sclerosing lesion  . BREAST BIOPSY Left 12/03/2018   Procedure: BREAST BIOPSY WITH NEEDLE LOCALIZATION;  Surgeon: Herbert Pun, MD;  Location: [facility] ORS;  Service: General;  Laterality: Left;  . BREAST  EXCISIONAL BIOPSY Left 12/03/2018   Sclerosing lesion  . BREAST LUMPECTOMY Left    sclerosing lesion  . DILATION AND CURETTAGE OF UTERUS    . HYSTEROSCOPY  08/2010   secretory endometrium    Current Outpatient Medications on File Prior to Visit  Medication Sig Dispense Refill  . acetaminophen (TYLENOL) 500 MG tablet Take 1,000 mg by mouth daily as needed for moderate pain or headache.    . cetirizine (ZYRTEC) 10 MG tablet Take 10 mg by mouth daily as needed for allergies.    Marland Kitchen FLUoxetine (PROZAC) 10 MG capsule TAKE 1 CAPSULE (10 MG TOTAL) BY MOUTH DAILY. 90 capsule 2  . ibuprofen (ADVIL,MOTRIN) 200 MG tablet Take 400 mg by mouth every 6 (six) hours as needed for headache or moderate pain.    . medroxyPROGESTERone (DEPO-PROVERA) 150 MG/ML injection Inject 1 mL (150 mg total) into the muscle every 3 (three) months. 1 mL 4  . methimazole (TAPAZOLE) 5 MG tablet Take 2.5 mg by mouth daily.      No current facility-administered medications on file prior to visit.     Allergies  Allergen Reactions  . Levaquin [Levofloxacin In D5w] Rash    Around jaw and throat   . Penicillins Rash    DID THE REACTION INVOLVE: Swelling of the face/tongue/throat, SOB, or low BP? No Sudden or severe rash/hives, skin peeling, or  the inside of the mouth or nose? Yes Did it require medical treatment? No When did it last happen?10 years ago If all above answers are "NO", may proceed with cephalosporin use.     Social History:  reports that she has never smoked. She has never used smokeless tobacco. She reports that she does not drink alcohol or use drugs.  Family History  Problem Relation Age of Onset  . Endometriosis Mother   . Heart disease Mother   . Diabetes Father   . Heart disease Maternal Uncle   . Heart disease Maternal Grandmother   . Cancer Paternal Grandmother        colon  . Diabetes Paternal Grandmother   . Breast cancer Neg Hx     The following portions of the patient's history  were reviewed and updated as appropriate: allergies, current medications, past family history, past medical history, past social history, past surgical history and problem list.  Review of Systems Pertinent items noted in HPI and remainder of comprehensive ROS otherwise negative.  Physical Exam:  There were no vitals taken for this visit. CONSTITUTIONAL: Well-developed, well-nourished female in no acute distress.  HENT:  Normocephalic, atraumatic, External right and left ear normal. Oropharynx is clear and moist EYES: Conjunctivae and EOM are normal. Pupils are equal, round, and reactive to light. No scleral icterus.  NECK: Normal range of motion, supple, no masses.  Normal thyroid.  SKIN: Skin is warm and dry. No rash noted. Not diaphoretic. No erythema. No pallor. MUSCULOSKELETAL: Normal range of motion. No tenderness.  No cyanosis, clubbing, or edema.  2+ distal pulses. NEUROLOGIC: Alert and oriented to person, place, and time. Normal reflexes, muscle tone coordination.  PSYCHIATRIC: Normal mood and affect. Normal behavior. Normal judgment and thought content. CARDIOVASCULAR: Normal heart rate noted, regular rhythm RESPIRATORY: Clear to auscultation bilaterally. Effort and breath sounds normal, no problems with respiration noted. BREASTS: Symmetric in size. No masses, tenderness, skin changes, nipple drainage, or lymphadenopathy bilaterally. ABDOMEN: Soft, no distention noted.  No tenderness, rebound or guarding.  PELVIC: Normal appearing external genitalia and urethral meatus; normal appearing vaginal mucosa and cervix.  No abnormal discharge noted.  Normal uterine size, no other palpable masses, no uterine or adnexal tenderness.   Assessment and Plan:  Ann[facility] Well Women GYN exam  Pap smear not due until 2021 Mammogram ordered Colonoscopy- pt state her mother had large polyp removed, was instructed to have a colonocopy by 38yrs old, will order next yr.  Refill: prozac, depo Offered  counseling due to stress/anxiety with mother in law-declines at this time.  Routine preventative health maintenance measures emphasized. Please refer to After Visit Summary for other counseling recommendations.      Philip Aspen, CNM

## 2019-11-10 ENCOUNTER — Other Ambulatory Visit: Payer: Self-pay | Admitting: Certified Nurse Midwife

## 2019-11-10 DIAGNOSIS — Z1231 Encounter for screening mammogram for malignant neoplasm of breast: Secondary | ICD-10-CM

## 2019-11-17 ENCOUNTER — Ambulatory Visit
Admission: RE | Admit: 2019-11-17 | Discharge: 2019-11-17 | Disposition: A | Discharge status: Home / Self Care | Payer: 59 | Source: Ambulatory Visit | Attending: Certified Nurse Midwife | Admitting: Certified Nurse Midwife

## 2019-11-17 DIAGNOSIS — Z1231 Encounter for screening mammogram for malignant neoplasm of breast: Secondary | ICD-10-CM | POA: Diagnosis present

## 2019-11-24 ENCOUNTER — Ambulatory Visit: Payer: 59

## 2019-12-16 ENCOUNTER — Ambulatory Visit: Payer: 59

## 2019-12-23 IMAGING — MG NEEDLE LOCALIZATION OF THE LEFT BREAST WITH MAMMO GUIDANCE
7 series · 9 of 23 positions shown · non-contrast
Comparison: Previous exams.

CLINICAL DATA: Preoperative needle localization for a left breast
distortion, post stereotactic core needle biopsy with benign
results, which were deemed discordant.

EXAM:
NEEDLE LOCALIZATION OF THE LEFT BREAST WITH MAMMO GUIDANCE

[L FB]
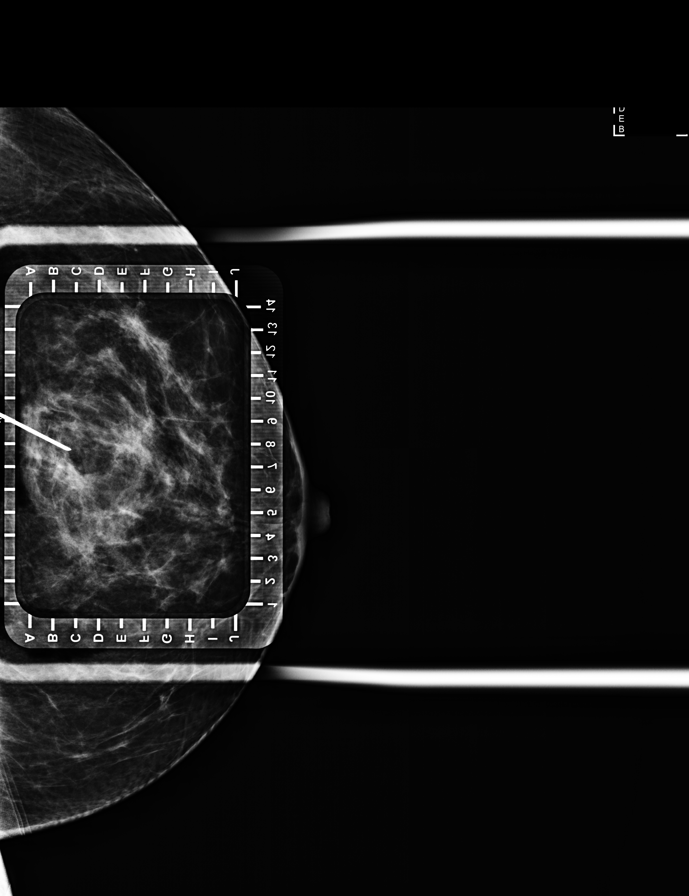

[L LM (1 of 2)]
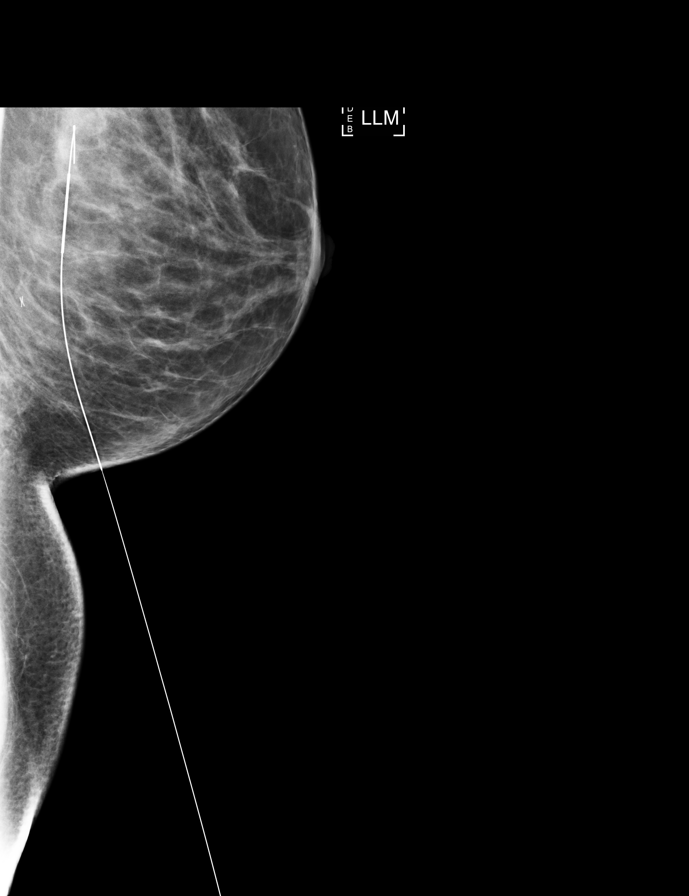

[L LM (2 of 2)]
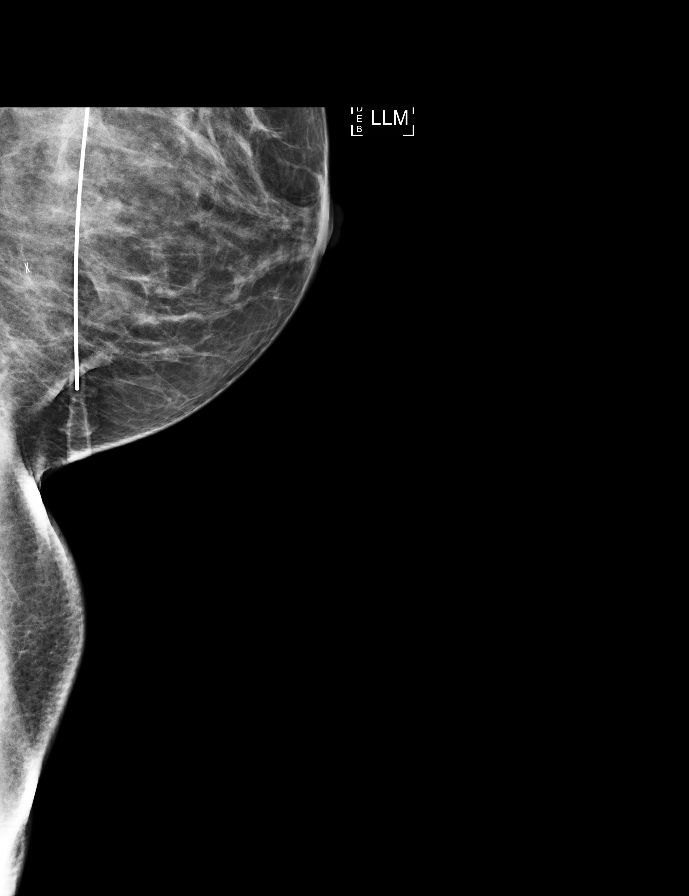

[L LM tomo · 3 of 88 frames shown (1 of 2)]
[frame 29/88]
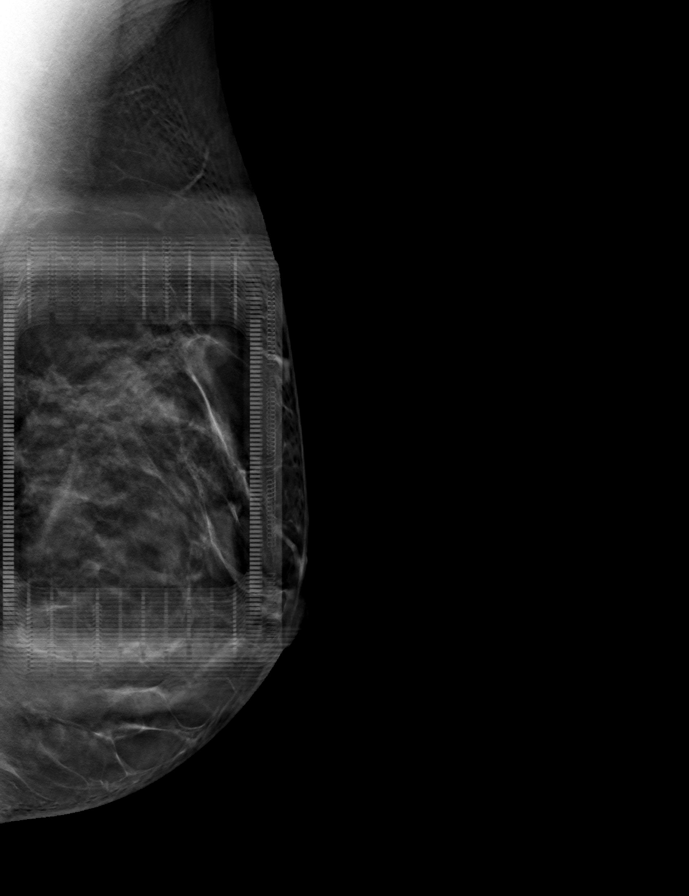
[frame 45/88]
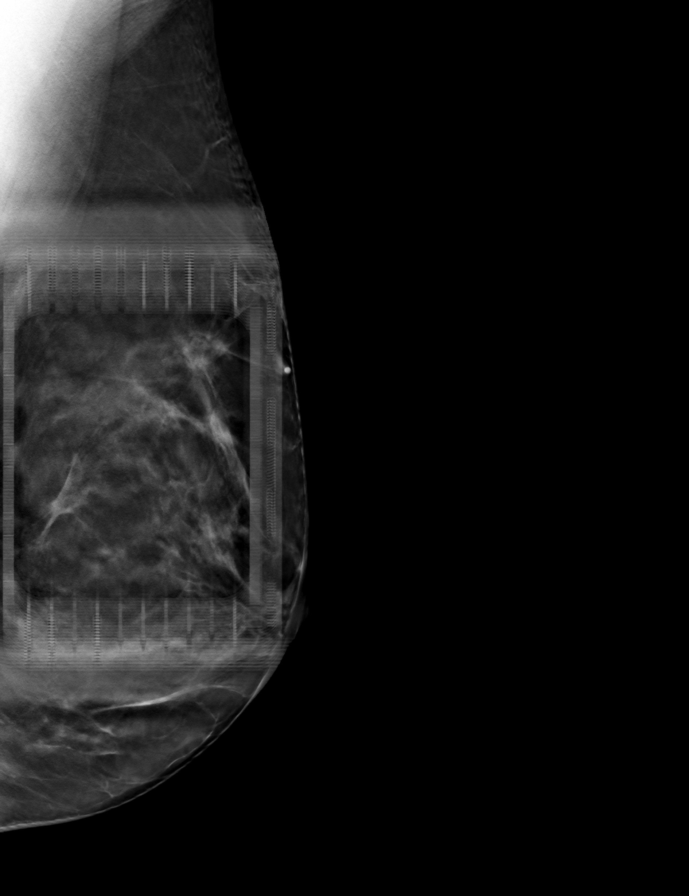
[frame 60/88]
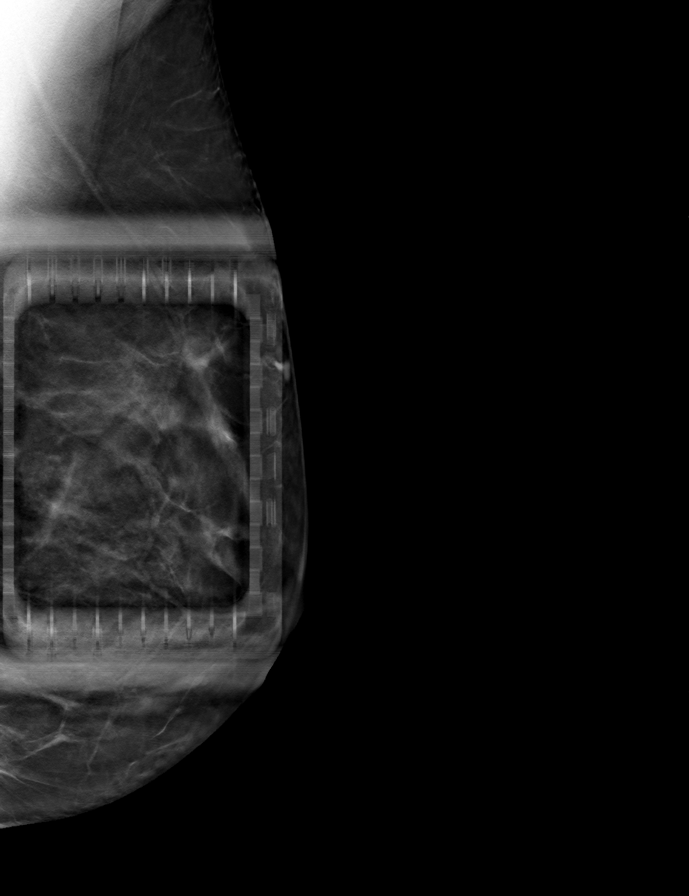

[L FB tomo (1 of 2) · tomo slice 45/88.0]
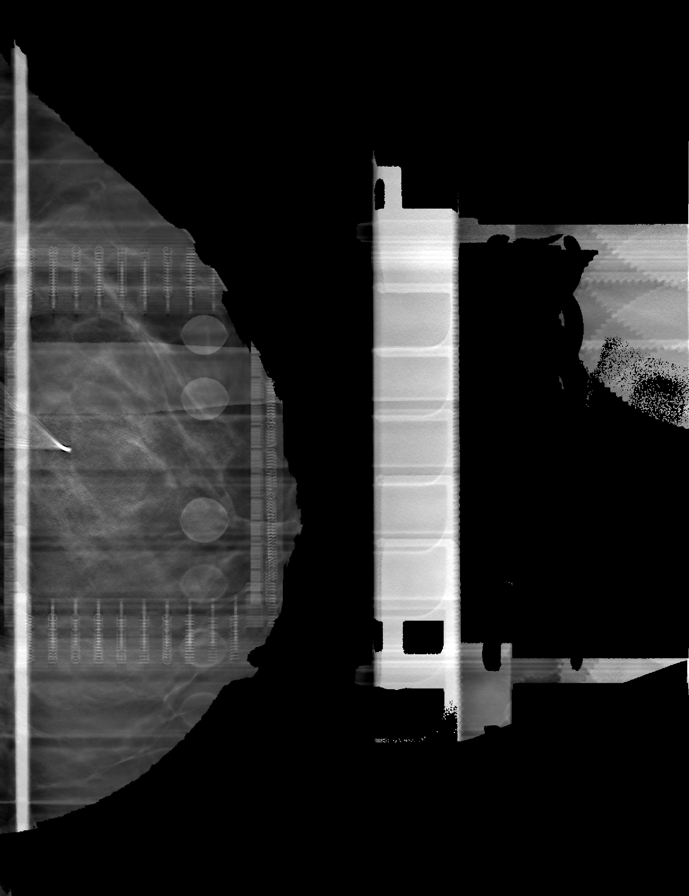

[L FB tomo (2 of 2) · tomo slice 45/89.0]
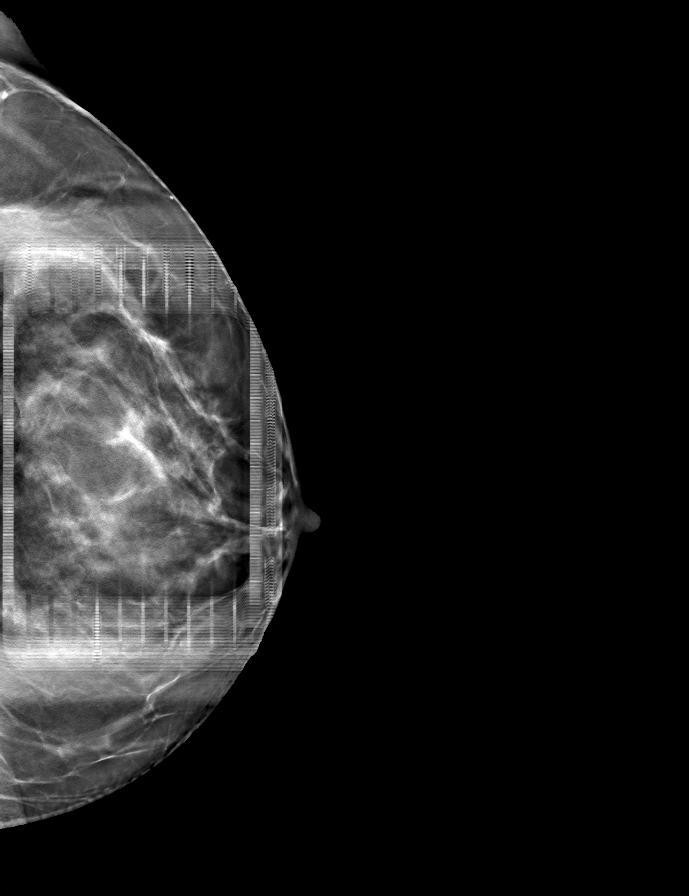

[L LM tomo (2 of 2) · tomo slice 43/85.0]
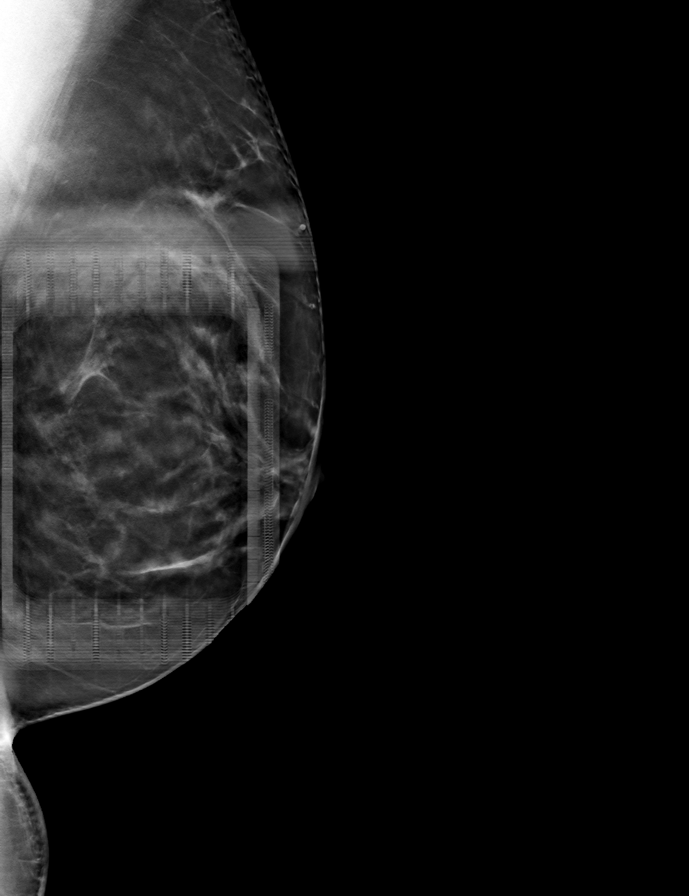

[9 of 23 positions shown; findings below may reference images not displayed]

FINDINGS: Patient presents for needle localization prior to left breast
excisional biopsy. I met with the patient and we discussed the
procedure of needle localization including benefits and
alternatives. We discussed the high likelihood of a successful
procedure. We discussed the risks of the procedure, including
infection, bleeding, tissue injury, and further surgery. Informed,
written consent was given. The usual time-out protocol was performed
immediately prior to the procedure.

Using mammographic guidance, sterile technique, 1% lidocaine and a 9
cm modified Kopans needle, left breast distortion was localized
using inferior approach. Please note that the previously placed X
shaped post biopsy marker was noted to have displaced inferiorly,
and therefore was not used for guidance. The images were marked for
Dr. Ivana Sarah A.
IMPRESSION: Needle localization of the left breast. No apparent complications.

## 2019-12-26 ENCOUNTER — Ambulatory Visit: Payer: 59

## 2020-01-02 ENCOUNTER — Ambulatory Visit (INDEPENDENT_AMBULATORY_CARE_PROVIDER_SITE_OTHER): Payer: 59 | Admitting: Certified Nurse Midwife

## 2020-01-02 ENCOUNTER — Other Ambulatory Visit: Payer: Self-pay

## 2020-01-02 VITALS — BP 109/70 | HR 92 | Ht 66.0 in | Wt 149.0 lb

## 2020-01-02 DIAGNOSIS — Z3042 Encounter for surveillance of injectable contraceptive: Secondary | ICD-10-CM | POA: Diagnosis not present

## 2020-01-02 LAB — POCT URINE PREGNANCY: Preg Test, Ur: NEGATIVE

## 2020-01-02 MED ORDER — MEDROXYPROGESTERONE ACETATE 150 MG/ML IM SUSP
150.0000 mg | Freq: Once | INTRAMUSCULAR | Status: AC
  Administered 2020-01-02: 15:00:00 150 mg via INTRAMUSCULAR

## 2020-01-02 NOTE — Progress Notes (Signed)
Date last pap: 10/02/15. Last Depo-Provera: 09/2219. UPT-NEG Side Effects if any: none. Serum HCG indicated? N/A.  Depo-Provera 150 mg IM given by: FHampton, LPN. Next appointment due April 12-26, 2021.  BP 109/70   Pulse 92   Ht 5\' 6"  (1.676 m)   Wt 149 lb (67.6 kg)   BMI 24.05 kg/m

## 2020-02-29 ENCOUNTER — Telehealth: Payer: Self-pay | Admitting: Certified Nurse Midwife

## 2020-02-29 NOTE — Telephone Encounter (Signed)
Called pt after talking to ss she stated to call the pharmacy. I advise the pt to call. Pt will call us back with what the pharmacy says

## 2020-02-29 NOTE — Telephone Encounter (Signed)
Pt called in and stated that she is getting her covid shot on the 16th. The pt wants to know will her depo need to be moved. Pt is requesting a call back. The pt did have me move her appt to the 26th. Please advise

## 2020-03-26 ENCOUNTER — Ambulatory Visit: Payer: 59

## 2020-04-02 ENCOUNTER — Other Ambulatory Visit: Payer: Self-pay

## 2020-04-02 ENCOUNTER — Ambulatory Visit (INDEPENDENT_AMBULATORY_CARE_PROVIDER_SITE_OTHER): Payer: 59 | Admitting: Certified Nurse Midwife

## 2020-04-02 VITALS — BP 113/78 | HR 83 | Ht 66.0 in | Wt 147.4 lb

## 2020-04-02 DIAGNOSIS — Z3042 Encounter for surveillance of injectable contraceptive: Secondary | ICD-10-CM

## 2020-04-02 MED ORDER — MEDROXYPROGESTERONE ACETATE 150 MG/ML IM SUSP
150.0000 mg | Freq: Once | INTRAMUSCULAR | Status: AC
  Administered 2020-04-02: 150 mg via INTRAMUSCULAR

## 2020-04-02 NOTE — Progress Notes (Signed)
Date last pap: 10/02/2015. Last Depo-Provera: 01/02/2020. Side Effects if any: none. Serum HCG indicated? n/a. Depo-Provera 150 mg IM given by: FHampton, LPN. Next appointment due July 12-26, 2021.   BP 113/78   Pulse 83   Ht 5\' 6"  (1.676 m)   Wt 147 lb 6.4 oz (66.9 kg)   LMP  (LMP Unknown)   BMI 23.79 kg/m

## 2020-04-09 ENCOUNTER — Ambulatory Visit: Payer: 59

## 2020-04-23 ENCOUNTER — Telehealth: Payer: Self-pay | Admitting: Certified Nurse Midwife

## 2020-04-23 NOTE — Telephone Encounter (Signed)
Patient is needing medical advise regarding depo shot and sleep patterns.Thank you

## 2020-04-30 ENCOUNTER — Ambulatory Visit (INDEPENDENT_AMBULATORY_CARE_PROVIDER_SITE_OTHER): Payer: 59 | Admitting: Certified Nurse Midwife

## 2020-04-30 ENCOUNTER — Other Ambulatory Visit: Payer: Self-pay

## 2020-04-30 ENCOUNTER — Encounter: Payer: Self-pay | Admitting: Certified Nurse Midwife

## 2020-04-30 VITALS — BP 105/72 | HR 74 | Ht 66.0 in | Wt 148.1 lb

## 2020-04-30 DIAGNOSIS — F419 Anxiety disorder, unspecified: Secondary | ICD-10-CM | POA: Diagnosis not present

## 2020-04-30 DIAGNOSIS — F410 Panic disorder [episodic paroxysmal anxiety] without agoraphobia: Secondary | ICD-10-CM | POA: Diagnosis not present

## 2020-04-30 MED ORDER — ZOLPIDEM TARTRATE ER 6.25 MG PO TBCR: 6.2500 mg | EXTENDED_RELEASE_TABLET | Freq: Every evening | ORAL | 0 refills | Status: DC | PRN

## 2020-04-30 NOTE — Progress Notes (Addendum)
GYN ENCOUNTER NOTE  Subjective:       Donna Mejia is a 39 y.o. G43P1102 female is here for gynecologic evaluation of the following issues:  1. Insomnia, she states she continues to have relationship issues with her mother in law and more recently has had some issues with her husband. He has accused her of masterbating and or having an affair.  Her anxiety has increased so much that she t is having difficulty sleeping.  She states she has trouble falling asleep and staying asleep. She will wake when her husband comes in or spontaneously a few times in the night. She has tried herbal supplements, melatonin, and dramamine. These did not help her. She states she had some xanax at home that she took that did help her sleep.    Gynecologic History No LMP recorded (lmp unknown). Patient has had an injection. Contraception: Depo-Provera injections Last Pap: 09/2015. Results were: normal Last mammogram: n/a .   Obstetric History OB History  Gravida Para Term Preterm AB Living  2 2 1 1   2   SAB TAB Ectopic Multiple Live Births          2    # Outcome Date GA Lbr Len/2nd Weight Sex Delivery Anes PTL Lv  2 Preterm 07/30/09   5 lb 9 oz (2.523 kg) M Vag-Spont  Y LIV  1 Term 01/19/08   6 lb 9 oz (2.977 kg) F Vag-Spont  N LIV    Past Medical History:  Diagnosis Date  . Dysmenorrhea   . Hyperthyroidism   . IMB (intermenstrual bleeding)   . PCOD (polycystic ovarian disease)   . Pelvic pain in female     Past Surgical History:  Procedure Laterality Date  . BREAST BIOPSY Left 11/19/2018   Sclerosing lesion  . BREAST BIOPSY Left 12/03/2018   Procedure: BREAST BIOPSY WITH NEEDLE LOCALIZATION;  Surgeon: Herbert Pun, MD;  Location: [facility] ORS;  Service: General;  Laterality: Left;  . BREAST EXCISIONAL BIOPSY Left 12/03/2018   Sclerosing lesion  . BREAST LUMPECTOMY Left    sclerosing lesion  . DILATION AND CURETTAGE OF UTERUS    . HYSTEROSCOPY  08/2010   secretory endometrium     Current Outpatient Medications on File Prior to Visit  Medication Sig Dispense Refill  . acetaminophen (TYLENOL) 500 MG tablet Take 1,000 mg by mouth daily as needed for moderate pain or headache.    . cetirizine (ZYRTEC) 10 MG tablet Take 10 mg by mouth daily as needed for allergies.    Marland Kitchen FLUoxetine (PROZAC) 10 MG capsule Take 1 capsule (10 mg total) by mouth daily. 90 capsule 2  . ibuprofen (ADVIL,MOTRIN) 200 MG tablet Take 400 mg by mouth every 6 (six) hours as needed for headache or moderate pain.    . medroxyPROGESTERone (DEPO-PROVERA) 150 MG/ML injection Inject 1 mL (150 mg total) into the muscle every 3 (three) months. 1 mL 4  . methimazole (TAPAZOLE) 5 MG tablet Take 2.5 mg by mouth daily.      No current facility-administered medications on file prior to visit.    Allergies  Allergen Reactions  . Levaquin [Levofloxacin In D5w] Rash    Around jaw and throat   . Penicillins Rash    DID THE REACTION INVOLVE: Swelling of the face/tongue/throat, SOB, or low BP? No Sudden or severe rash/hives, skin peeling, or the inside of the mouth or nose? Yes Did it require medical treatment? No When did it last happen?10 years ago If all  above answers are "NO", may proceed with cephalosporin use.     Social History   Socioeconomic History  . Marital status: Married    Spouse name: Not on file  . Number of children: Not on file  . Years of education: Not on file  . Highest education level: Not on file  Occupational History  . Not on file  Tobacco Use  . Smoking status: Never Smoker  . Smokeless tobacco: Never Used  Substance and Sexual Activity  . Alcohol use: No  . Drug use: No  . Sexual activity: Yes    Birth control/protection: Injection    Comment: hysband-vasectomy  Other Topics Concern  . Not on file  Social History Narrative  . Not on file   Social Determinants of Health   Financial Resource Strain:   . Difficulty of Paying Living Expenses:   Food  Insecurity:   . Worried About Charity fundraiser in the Last Year:   . Arboriculturist in the Last Year:   Transportation Needs:   . Film/video editor (Medical):   Marland Kitchen Lack of Transportation (Non-Medical):   Physical Activity:   . Days of Exercise per Week:   . Minutes of Exercise per Session:   Stress:   . Feeling of Stress :   Social Connections:   . Frequency of Communication with Friends and Family:   . Frequency of Social Gatherings with Friends and Family:   . Attends Religious Services:   . Active Member of Clubs or Organizations:   . Attends Archivist Meetings:   Marland Kitchen Marital Status:   Intimate Partner Violence:   . Fear of Current or Ex-Partner:   . Emotionally Abused:   Marland Kitchen Physically Abused:   . Sexually Abused:     Family History  Problem Relation Age of Onset  . Endometriosis Mother   . Heart disease Mother   . Diabetes Father   . Heart disease Maternal Uncle   . Heart disease Maternal Grandmother   . Cancer Paternal Grandmother        colon  . Diabetes Paternal Grandmother   . Breast cancer Neg Hx     The following portions of the patient's history were reviewed and updated as appropriate: allergies, current medications, past family history, past medical history, past social history, past surgical history and problem list.  Review of Systems Review of Systems - Negative except as mentioned in HPI Review of Systems - General ROS: negative for - chills, fatigue, fever, hot flashes, malaise or night sweats. Positive for insomnia  Hematological and Lymphatic ROS: negative for - bleeding problems or swollen lymph nodes Gastrointestinal ROS: negative for - abdominal pain, blood in stools, change in bowel habits and nausea/vomiting Musculoskeletal ROS: negative for - joint pain, muscle pain or muscular weakness Genito-Urinary ROS: negative for - change in menstrual cycle, dysmenorrhea, dyspareunia, dysuria, genital discharge, genital ulcers, hematuria,  incontinence, irregular/heavy menses, nocturia or pelvic pain  Objective:   BP 105/72   Pulse 74   Ht 5\' 6"  (1.676 m)   Wt 148 lb 1 oz (67.2 kg)   LMP  (LMP Unknown)   BMI 23.90 kg/m  CONSTITUTIONAL: Well-developed, well-nourished female in mild distress. Insomnia HENT:  Normocephalic, atraumatic.  NECK: Normal range of motion, supple, no masses.  Normal thyroid.  SKIN: Skin is warm and dry. No rash noted. Not diaphoretic. No erythema. No pallor. [place]: Alert and oriented to person, place, and time. PSYCHIATRIC: Normal mood and  affect. Normal behavior. Normal judgment and thought content. CARDIOVASCULAR:Not Examined RESPIRATORY: Not Examined BREASTS: Not Examined ABDOMEN: Soft, non distended; Non tender.  No Organomegaly. PELVIC:not indicated MUSCULOSKELETAL: Normal range of motion. No tenderness.  No cyanosis, clubbing, or edema.  GAD 7 : Generalized Anxiety Score 04/30/2020  Nervous, Anxious, on Edge 3  Control/stop worrying 3  Worry too much - different things 3  Trouble relaxing 3  Restless 3  Easily annoyed or irritable 3  Afraid - awful might happen 1  Total GAD 7 Score 19  Anxiety Difficulty Extremely difficult      Office Visit from 04/30/2020 in [facility]  PHQ-9 Total Score  17       Assessment:   Anxiety Insomnia  Depression  Plan:   Discussed sleep hygiene, reviewed self help measures. Urgent referral for psychiatry placed. Ambien ordered.  Discussed risks and benefits with use of medications. Recommend counseling for herself and with her partner. Follow up for annual exam or sooner as needed.   Philip Aspen, CNM

## 2020-04-30 NOTE — Patient Instructions (Signed)
Insomnia Insomnia is a sleep disorder that makes it difficult to fall asleep or stay asleep. Insomnia can cause fatigue, low energy, difficulty concentrating, mood swings, and poor performance at work or school. There are three different ways to classify insomnia:  Difficulty falling asleep.  Difficulty staying asleep.  Waking up too early in the morning. Any type of insomnia can be long-term (chronic) or short-term (acute). Both are common. Short-term insomnia usually lasts for three months or less. Chronic insomnia occurs at least three times a week for longer than three months. What are the causes? Insomnia may be caused by another condition, situation, or substance, such as:  Anxiety.  Certain medicines.  Gastroesophageal reflux disease (GERD) or other gastrointestinal conditions.  Asthma or other breathing conditions.  Restless legs syndrome, sleep apnea, or other sleep disorders.  Chronic pain.  Menopause.  Stroke.  Abuse of alcohol, tobacco, or illegal drugs.  Mental health conditions, such as depression.  Caffeine.  Neurological disorders, such as Alzheimer's disease.  An overactive thyroid (hyperthyroidism). Sometimes, the cause of insomnia may not be known. What increases the risk? Risk factors for insomnia include:  Gender. Women are affected more often than men.  Age. Insomnia is more common as you get older.  Stress.  Lack of exercise.  Irregular work schedule or working night shifts.  Traveling between different time zones.  Certain medical and mental health conditions. What are the signs or symptoms? If you have insomnia, the main symptom is having trouble falling asleep or having trouble staying asleep. This may lead to other symptoms, such as:  Feeling fatigued or having low energy.  Feeling nervous about going to sleep.  Not feeling rested in the morning.  Having trouble concentrating.  Feeling irritable, anxious, or depressed. How  is this diagnosed? This condition may be diagnosed based on:  Your symptoms and medical history. Your health care provider may ask about: ? Your sleep habits. ? Any medical conditions you have. ? Your mental health.  A physical exam. How is this treated? Treatment for insomnia depends on the cause. Treatment may focus on treating an underlying condition that is causing insomnia. Treatment may also include:  Medicines to help you sleep.  Counseling or therapy.  Lifestyle adjustments to help you sleep better. Follow these instructions at home: Eating and drinking   Limit or avoid alcohol, caffeinated beverages, and cigarettes, especially close to bedtime. These can disrupt your sleep.  Do not eat a large meal or eat spicy foods right before bedtime. This can lead to digestive discomfort that can make it hard for you to sleep. Sleep habits   Keep a sleep diary to help you and your health care provider figure out what could be causing your insomnia. Write down: ? When you sleep. ? When you wake up during the night. ? How well you sleep. ? How rested you feel the next day. ? Any side effects of medicines you are taking. ? What you eat and drink.  Make your bedroom a dark, comfortable place where it is easy to fall asleep. ? Put up shades or blackout curtains to block light from outside. ? Use a white noise machine to block noise. ? Keep the temperature cool.  Limit screen use before bedtime. This includes: ? Watching TV. ? Using your smartphone, tablet, or computer.  Stick to a routine that includes going to bed and waking up at the same times every day and night. This can help you fall asleep faster. Consider   making a quiet activity, such as reading, part of your nighttime routine.  Try to avoid taking naps during the day so that you sleep better at night.  Get out of bed if you are still awake after 15 minutes of trying to sleep. Keep the lights down, but try reading or  doing a quiet activity. When you feel sleepy, go back to bed. General instructions  Take over-the-counter and prescription medicines only as told by your health care provider.  Exercise regularly, as told by your health care provider. Avoid exercise starting several hours before bedtime.  Use relaxation techniques to manage stress. Ask your health care provider to suggest some techniques that may work well for you. These may include: ? Breathing exercises. ? Routines to release muscle tension. ? Visualizing peaceful scenes.  Make sure that you drive carefully. Avoid driving if you feel very sleepy.  Keep all follow-up visits as told by your health care provider. This is important. Contact a health care provider if:  You are tired throughout the day.  You have trouble in your daily routine due to sleepiness.  You continue to have sleep problems, or your sleep problems get worse. Get help right away if:  You have serious thoughts about hurting yourself or someone else. If you ever feel like you may hurt yourself or others, or have thoughts about taking your own life, get help right away. You can go to your nearest emergency department or call:  Your local emergency services (911 in the [place]  A suicide crisis helpline, such as the National Suicide Prevention Lifeline at 1-800-273-8255. This is open 24 hours a day. Summary  Insomnia is a sleep disorder that makes it difficult to fall asleep or stay asleep.  Insomnia can be long-term (chronic) or short-term (acute).  Treatment for insomnia depends on the cause. Treatment may focus on treating an underlying condition that is causing insomnia.  Keep a sleep diary to help you and your health care provider figure out what could be causing your insomnia. This information is not intended to replace advice given to you by your health care provider. Make sure you discuss any questions you have with your health care provider. Document  Revised: 11/06/2017 Document Reviewed: 09/03/2017 Elsevier Patient Education  2020 [employer] Generalized Anxiety Disorder, Adult Generalized anxiety disorder (GAD) is a mental health disorder. People with this condition constantly worry about everyday events. Unlike normal anxiety, worry related to GAD is not triggered by a specific event. These worries also do not fade or get better with time. GAD interferes with life functions, including relationships, work, and school. GAD can vary from mild to severe. People with severe GAD can have intense waves of anxiety with physical symptoms (panic attacks). What are the causes? The exact cause of GAD is not known. What increases the risk? This condition is more likely to develop in:  Women.  People who have a family history of anxiety disorders.  People who are very shy.  People who experience very stressful life events, such as the death of a loved one.  People who have a very stressful family environment. What are the signs or symptoms? People with GAD often worry excessively about many things in their lives, such as their health and family. They may also be overly concerned about:  Doing well at work.  Being on time.  Natural disasters.  Friendships. Physical symptoms of GAD include:  Fatigue.  Muscle tension or having muscle twitches.  Trembling or   feeling shaky.  Being easily startled.  Feeling like your heart is pounding or racing.  Feeling out of breath or like you cannot take a deep breath.  Having trouble falling asleep or staying asleep.  Sweating.  Nausea, diarrhea, or irritable bowel syndrome (IBS).  Headaches.  Trouble concentrating or remembering facts.  Restlessness.  Irritability. How is this diagnosed? Your health care provider can diagnose GAD based on your symptoms and medical history. You will also have a physical exam. The health care provider will ask specific questions about your symptoms,  including how severe they are, when they started, and if they come and go. Your health care provider may ask you about your use of alcohol or drugs, including prescription medicines. Your health care provider may refer you to a mental health specialist for further evaluation. Your health care provider will do a thorough examination and may perform additional tests to rule out other possible causes of your symptoms. To be diagnosed with GAD, a person must have anxiety that:  Is out of his or her control.  Affects several different aspects of his or her life, such as work and relationships.  Causes distress that makes him or her unable to take part in normal activities.  Includes at least three physical symptoms of GAD, such as restlessness, fatigue, trouble concentrating, irritability, muscle tension, or sleep problems. Before your health care provider can confirm a diagnosis of GAD, these symptoms must be present more days than they are not, and they must last for six months or longer. How is this treated? The following therapies are usually used to treat GAD:  Medicine. Antidepressant medicine is usually prescribed for long-term daily control. Antianxiety medicines may be added in severe cases, especially when panic attacks occur.  Talk therapy (psychotherapy). Certain types of talk therapy can be helpful in treating GAD by providing support, education, and guidance. Options include: ? Cognitive behavioral therapy (CBT). People learn coping skills and techniques to ease their anxiety. They learn to identify unrealistic or negative thoughts and behaviors and to replace them with positive ones. ? Acceptance and commitment therapy (ACT). This treatment teaches people how to be mindful as a way to cope with unwanted thoughts and feelings. ? Biofeedback. This process trains you to manage your body's response (physiological response) through breathing techniques and relaxation methods. You will work  with a therapist while machines are used to monitor your physical symptoms.  Stress management techniques. These include yoga, meditation, and exercise. A mental health specialist can help determine which treatment is best for you. Some people see improvement with one type of therapy. However, other people require a combination of therapies. Follow these instructions at home:  Take over-the-counter and prescription medicines only as told by your health care provider.  Try to maintain a normal routine.  Try to anticipate stressful situations and allow extra time to manage them.  Practice any stress management or self-calming techniques as taught by your health care provider.  Do not punish yourself for setbacks or for not making progress.  Try to recognize your accomplishments, even if they are small.  Keep all follow-up visits as told by your health care provider. This is important. Contact a health care provider if:  Your symptoms do not get better.  Your symptoms get worse.  You have signs of depression, such as: ? A persistently sad, cranky, or irritable mood. ? Loss of enjoyment in activities that used to bring you joy. ? Change in weight or eating. ?   Changes in sleeping habits. ? Avoiding friends or family members. ? Loss of energy for normal tasks. ? Feelings of guilt or worthlessness. Get help right away if:  You have serious thoughts about hurting yourself or others. If you ever feel like you may hurt yourself or others, or have thoughts about taking your own life, get help right away. You can go to your nearest emergency department or call:  Your local emergency services (911 in the [place]  A suicide crisis helpline, such as the National Suicide Prevention Lifeline at 1-800-273-8255. This is open 24 hours a day. Summary  Generalized anxiety disorder (GAD) is a mental health disorder that involves worry that is not triggered by a specific event.  People with GAD  often worry excessively about many things in their lives, such as their health and family.  GAD may cause physical symptoms such as restlessness, trouble concentrating, sleep problems, frequent sweating, nausea, diarrhea, headaches, and trembling or muscle twitching.  A mental health specialist can help determine which treatment is best for you. Some people see improvement with one type of therapy. However, other people require a combination of therapies. This information is not intended to replace advice given to you by your health care provider. Make sure you discuss any questions you have with your health care provider. Document Revised: 11/06/2017 Document Reviewed: 10/14/2016 Elsevier Patient Education  2020 [employer]  

## 2020-06-19 ENCOUNTER — Ambulatory Visit: Payer: 59

## 2020-06-20 ENCOUNTER — Ambulatory Visit: Payer: 59

## 2020-06-22 ENCOUNTER — Ambulatory Visit: Payer: 59

## 2020-06-25 ENCOUNTER — Ambulatory Visit: Payer: 59

## 2020-10-07 ENCOUNTER — Other Ambulatory Visit: Payer: Self-pay | Admitting: Certified Nurse Midwife

## 2020-10-22 ENCOUNTER — Other Ambulatory Visit: Payer: Self-pay | Admitting: Obstetrics & Gynecology

## 2020-10-22 NOTE — H&P (Signed)
HPI: 06/06/2020: Menstrual issues: long history of heavy bleeding and uses depo to control this, but anxiety, depression and insomnia are worsened by depo."I feel like I'm on a roller coaster and I want off the ride" She is opposed to hormonal manipulation, endometrial ablation, and desires definitive intervention for menorrhagia and pregnancy prevention.  Workup:  Pap: 2016: normal per patient; 2021: NILM, HPV neg   EMBx: 06/2020: benign   TVUS: 06/2020 Uterusretroverted 7 x 4 x 5 cm, normal shape/contour EE43mm 2 Fibroids seen  -Mid 1.5 cm  -Left Lateral 1.5 cm LO3 x 2 x 2cm RO4 x 2.5 x 2cm   Past Medical History:  has a past medical history of Anemia, Endometriosis of uterus, Headache, History of abnormal cervical Pap smear, History of chicken pox, PCOS (polycystic ovarian syndrome), and Subclinical hyperthyroidism, unspecified.  Past Surgical History:  has a past surgical history that includes Dilation and curettage, diagnostic / therapeutic (08/2010) and Left breast excisional biopsy with NL (Left, 12/03/2018). Family History: family history includes Coronary Artery Disease (Blocked arteries around heart) in her father; High blood pressure (Hypertension) in her brother, father, mother, and sister; Hyperlipidemia (Elevated cholesterol) in her brother, father, and mother. Social History:  reports that she has never smoked. She has never used smokeless tobacco. She reports that she does not drink alcohol and does not use drugs. OB/GYN History:          OB History    Gravida  2   Para      Term      Preterm      AB      Living  2     SAB      IAB      Ectopic      Molar      Multiple      Live Births             Allergies: is allergic to levaquin [levofloxacin] and penicillin v potassium. Medications:  Current Outpatient Medications:  .  FLUoxetine (PROZAC) 10 MG capsule, Take 10 mg by mouth once daily  , Disp: , Rfl:  .  methIMAzole  (TAPAZOLE) 5 MG tablet, Take 0.5 tablets (2.5 mg total) by mouth once daily, Disp: 45 tablet, Rfl: 4 .  medroxyprogesterone acetate (DEPO-PROVERA IM), Inject into the muscle every 3 (three) months (Patient not taking: Reported on 07/03/2020  ), Disp: , Rfl:    Review of Systems: No SOB, no palpitations or chest pain, no new lower extremity edema, no nausea or vomiting or bowel or bladder complaints. See HPI for gyn specific ROS.   Exam:   Constitutional: BP 119/81   Pulse 68   Ht 165.1 cm (5\' 5" )   Wt 70.2 kg (154 lb 12.8 oz)   BMI 25.76 kg/m   WDWN female in NAD   HEENT: sclera clear, non-icteric, moist mucous membranes  Endocrine:  no thyromegaly Respiratory: normal respiratory effort, CTABL    CV: no peripheral edema, RRR no MRG GI: soft , no mass, non-tender, no rebound tenderness  ON:GEXBMW stage 5 ,  External genitalia/skin: vulva /labia no lesions Lymphatic: no enlarged inguinal nodes bilaterally Urethra: no prolapse, no diverticulum, no caruncle Bladder: no tenderness to palpation, no cystocele  Vagina: normal physiologic d/c, no lesions, normal apical supportobliteration of the anterior fornix with scarring of anterior cervix to the anterior compartment, minimal decent with valsalva  Cervix: no lesions, no cervical motion tenderness Uterus:retroverted,normal size shape and contour, non-tender, mobile Adnexa: no masses  bilaterally, non-tender  Skin: warm and well perfused, no rashes Neuro: alert, oriented x3,   Psych: appropriate mood and insight, judgement intact   Impression:   The primary encounter diagnosis was Diagnoses of Menorrhagia with regular cycle, Anxiety, Screening for human papillomavirus (HPV), Cervical cancer screening, Encounter for gynecological examination, and Pelvic pain in female, and Preoperative evaluation to rule out surgical  contraindication were also pertinent to this visit.  Plan:   Menorrhagia / Pelvic Pain Due to Pelvic Pain would like to do surgery laparoscopically  Pap smear and preop exam completed today  Explained procedure, risks and benefits in detail   Planned procedure:Total LaparoscopicHysterectomyand bilateral salpingectomy.  The patient and I discussed the technical aspects of the procedure including the potential for risks and complications.These include but are not limited to the risk of infection requiring post-operative antibiotics or further procedures.We talked about the risk of injury to adjacent organs including bladder, bowel, ureter, blood vessels or nerves, the need to convert to an open incision, possibleneed for blood transfusion andpostop complications such asthromboembolic or cardiopulmonary complications.All of her questions were answered. Her preoperative exam was completed. She is scheduled to undergo this procedure in the near future.   I personally performed the service. (TP)  Donna Mejia CRIST Alynna Hargrove, MD

## 2020-10-29 ENCOUNTER — Other Ambulatory Visit: Payer: Self-pay

## 2020-10-29 ENCOUNTER — Encounter
Admission: RE | Admit: 2020-10-29 | Discharge: 2020-10-29 | Disposition: A | Discharge status: Home / Self Care | Payer: 59 | Source: Ambulatory Visit | Attending: Obstetrics & Gynecology | Admitting: Obstetrics & Gynecology

## 2020-10-29 HISTORY — DX: Family history of other specified conditions: Z84.89

## 2020-10-29 HISTORY — DX: Hypothyroidism, unspecified: E03.9

## 2020-10-29 HISTORY — DX: Anxiety disorder, unspecified: F41.9

## 2020-10-29 HISTORY — DX: Nausea with vomiting, unspecified: R11.2

## 2020-10-29 HISTORY — DX: Other specified postprocedural states: Z98.890

## 2020-10-29 NOTE — Patient Instructions (Addendum)
Your procedure is scheduled on:11-05-20 MONDAY Report to the Registration Desk on the 1st floor of the [facility]-Then proceed to the 2nd floor Surgery Desk To find out your arrival time, please call [phone] between 1PM - 3PM on:11-02-20 FRIDAY  REMEMBER: Instructions that are not followed completely may result in serious medical risk, up to and including death; or upon the discretion of your surgeon and anesthesiologist your surgery may need to be rescheduled.  Do not eat food after midnight the night before surgery.  No gum chewing, lozengers or hard candies.  You may however, drink CLEAR liquids up to 2 hours before you are scheduled to arrive for your surgery. Do not drink anything within 2 hours of your scheduled arrival time.  Clear liquids include: - water  - apple juice without pulp - gatorade (not RED, PURPLE, OR BLUE) - black coffee or tea (Do NOT add milk or creamers to the coffee or tea) Do NOT drink anything that is not on this list.  In addition, your doctor has ordered for you to drink the provided  Ensure Pre-Surgery Clear Carbohydrate Drink  Drinking this carbohydrate drink up to two hours before surgery helps to reduce insulin resistance and improve patient outcomes. Please complete drinking 2 hours prior to scheduled arrival time.  TAKE THESE MEDICATIONS THE MORNING OF SURGERY WITH A SIP OF WATER: -METHIMAZOLE (TAPAZOLE) -PROZAC (FLUOXETINE)  One week prior to surgery: Stop Anti-inflammatories (NSAIDS) such as Advil, Aleve, Ibuprofen, Motrin, Naproxen, Naprosyn and Aspirin based products such as Excedrin, Goodys Powder, BC Powder-OK TO TAKE TYLENOL IF NEEDED  Stop ANY OVER THE COUNTER supplements until after surgery.  No Alcohol for 24 hours before or after surgery.  No Smoking including e-cigarettes for 24 hours prior to surgery.  No chewable tobacco products for at least 6 hours prior to surgery.  No nicotine patches on the day of surgery.  Do not  use any "recreational" drugs for at least a week prior to your surgery.  Please be advised that the combination of cocaine and anesthesia may have negative outcomes, up to and including death. If you test positive for cocaine, your surgery will be cancelled.  On the morning of surgery brush your teeth with toothpaste and water, you may rinse your mouth with mouthwash if you wish. Do not swallow any toothpaste or mouthwash.  Do not wear jewelry, make-up, hairpins, clips or nail polish.  Do not wear lotions, powders, or perfumes.   Do not shave body from the neck down 48 hours prior to surgery just in case you cut yourself which could leave a site for infection.  Also, freshly shaved skin may become irritated if using the CHG soap.  Contact lenses, hearing aids and dentures may not be worn into surgery.  Do not bring valuables to the hospital. [facility] is not responsible for any missing/lost belongings or valuables.   Use CHG Soap as directed on instruction sheet  Notify your doctor if there is any change in your medical condition (cold, fever, infection).  Wear comfortable clothing (specific to your surgery type) to the hospital.  Plan for stool softeners for home use; pain medications have a tendency to cause constipation. You can also help prevent constipation by eating foods high in fiber such as fruits and vegetables and drinking plenty of fluids as your diet allows.  After surgery, you can help prevent lung complications by doing breathing exercises.  Take deep breaths and cough every 1-2 hours. Your doctor may  order a device called an Incentive Spirometer to help you take deep breaths. When coughing or sneezing, hold a pillow firmly against your incision with both hands. This is called "splinting." Doing this helps protect your incision. It also decreases belly discomfort.  If you are being admitted to the hospital overnight, leave your suitcase in the car. After surgery it  may be brought to your room.  If you are being discharged the day of surgery, you will not be allowed to drive home. You will need a responsible adult (18 years or older) to drive you home and stay with you that night.   If you are taking public transportation, you will need to have a responsible adult (18 years or older) with you. Please confirm with your physician that it is acceptable to use public transportation.   Please call the [facility] at [phone] if you have any questions about these instructions.  Visitation Policy:  Patients undergoing a surgery or procedure may have one family member or support person with them as long as that person is not COVID-19 positive or experiencing its symptoms.  That person may remain in the waiting area during the procedure.  Inpatient Visitation Update:   In an effort to ensure the safety of our team members and our patients, we are implementing a change to our visitation policy:  Effective Monday, Aug. 9, at 7 a.m., inpatients will be allowed one support person.  o The support person may change daily.  o The support person must pass our screening, gel in and out, and wear a mask at all times, including in the patient's room.  o Patients must also wear a mask when staff or their support person are in the room.  o Masking is required regardless of vaccination status.  Systemwide, no visitors 17 or younger.

## 2020-11-02 ENCOUNTER — Other Ambulatory Visit
Admission: RE | Admit: 2020-11-02 | Discharge: 2020-11-02 | Disposition: A | Discharge status: Home / Self Care | Payer: 59 | Source: Ambulatory Visit | Attending: Obstetrics & Gynecology | Admitting: Obstetrics & Gynecology

## 2020-11-02 ENCOUNTER — Other Ambulatory Visit: Payer: Self-pay

## 2020-11-02 DIAGNOSIS — Z20822 Contact with and (suspected) exposure to covid-19: Secondary | ICD-10-CM | POA: Insufficient documentation

## 2020-11-02 DIAGNOSIS — Z01812 Encounter for preprocedural laboratory examination: Secondary | ICD-10-CM | POA: Diagnosis present

## 2020-11-02 LAB — CBC
HCT: 43.7 % (ref 36.0–46.0)
Hemoglobin: 14.8 g/dL (ref 12.0–15.0)
MCH: 30.3 pg (ref 26.0–34.0)
MCHC: 33.9 g/dL (ref 30.0–36.0)
MCV: 89.5 fL (ref 80.0–100.0)
Platelets: 190 10*3/uL (ref 150–400)
RBC: 4.88 MIL/uL (ref 3.87–5.11)
RDW: 12.3 % (ref 11.5–15.5)
WBC: 9.1 10*3/uL (ref 4.0–10.5)
nRBC: 0.3 % — ABNORMAL HIGH (ref 0.0–0.2)

## 2020-11-02 LAB — TYPE AND SCREEN
ABO/RH(D): A POS
Antibody Screen: NEGATIVE

## 2020-11-02 LAB — BASIC METABOLIC PANEL
Anion gap: 9 (ref 5–15)
BUN: 13 mg/dL (ref 6–20)
CO2: 26 mmol/L (ref 22–32)
Calcium: 9.4 mg/dL (ref 8.9–10.3)
Chloride: 103 mmol/L (ref 98–111)
Creatinine, Ser: 0.84 mg/dL (ref 0.44–1.00)
GFR, Estimated: >60 mL/min (ref >60)
Glucose, Bld: 110 mg/dL — ABNORMAL HIGH (ref 70–99)
Potassium: 3.7 mmol/L (ref 3.5–5.1)
Sodium: 138 mmol/L (ref 135–145)

## 2020-11-02 LAB — SARS CORONAVIRUS 2 (TAT 6-24 HRS): SARS Coronavirus 2: NEGATIVE

## 2020-11-05 ENCOUNTER — Other Ambulatory Visit: Payer: Self-pay

## 2020-11-05 ENCOUNTER — Ambulatory Visit: Payer: 59 | Admitting: Anesthesiology

## 2020-11-05 ENCOUNTER — Ambulatory Visit
Admission: RE | Admit: 2020-11-05 | Discharge: 2020-11-05 | Disposition: A | Discharge status: Home / Self Care | Payer: 59 | Attending: Obstetrics & Gynecology | Admitting: Obstetrics & Gynecology

## 2020-11-05 ENCOUNTER — Encounter
Admission: RE | Disposition: A | Discharge status: Home / Self Care | Payer: Self-pay | Source: Home / Self Care | Attending: Obstetrics & Gynecology

## 2020-11-05 ENCOUNTER — Encounter: Payer: Self-pay | Admitting: Obstetrics & Gynecology

## 2020-11-05 DIAGNOSIS — Z881 Allergy status to other antibiotic agents status: Secondary | ICD-10-CM | POA: Insufficient documentation

## 2020-11-05 DIAGNOSIS — Z880 Allergy status to penicillin: Secondary | ICD-10-CM | POA: Insufficient documentation

## 2020-11-05 DIAGNOSIS — D260 Other benign neoplasm of cervix uteri: Secondary | ICD-10-CM | POA: Diagnosis not present

## 2020-11-05 DIAGNOSIS — R102 Pelvic and perineal pain: Secondary | ICD-10-CM | POA: Diagnosis not present

## 2020-11-05 DIAGNOSIS — N920 Excessive and frequent menstruation with regular cycle: Secondary | ICD-10-CM | POA: Insufficient documentation

## 2020-11-05 DIAGNOSIS — E282 Polycystic ovarian syndrome: Secondary | ICD-10-CM | POA: Insufficient documentation

## 2020-11-05 DIAGNOSIS — Z79899 Other long term (current) drug therapy: Secondary | ICD-10-CM | POA: Insufficient documentation

## 2020-11-05 HISTORY — PX: TOTAL LAPAROSCOPIC HYSTERECTOMY WITH SALPINGECTOMY: SHX6742

## 2020-11-05 LAB — POCT URINE PREGNANCY: Preg Test, Ur: NEGATIVE

## 2020-11-05 LAB — ABO/RH: ABO/RH(D): A POS

## 2020-11-05 SURGERY — HYSTERECTOMY, TOTAL, LAPAROSCOPIC, WITH SALPINGECTOMY
Anesthesia: General | Laterality: Bilateral

## 2020-11-05 MED ORDER — ROCURONIUM BROMIDE 10 MG/ML (PF) SYRINGE
PREFILLED_SYRINGE | INTRAVENOUS | Status: AC
  Filled 2020-11-05: qty 10

## 2020-11-05 MED ORDER — FENTANYL CITRATE (PF) 100 MCG/2ML IJ SOLN
INTRAMUSCULAR | Status: AC
  Filled 2020-11-05: qty 2

## 2020-11-05 MED ORDER — FAMOTIDINE 20 MG PO TABS: 20.0000 mg | ORAL_TABLET | Freq: Once | ORAL | Status: AC

## 2020-11-05 MED ORDER — PROMETHAZINE HCL 25 MG/ML IJ SOLN: 6.2500 mg | INTRAMUSCULAR | Status: DC | PRN

## 2020-11-05 MED ORDER — HEPARIN SODIUM (PORCINE) 5000 UNIT/ML IJ SOLN
INTRAMUSCULAR | Status: AC
  Administered 2020-11-05: 5000 [IU] via SUBCUTANEOUS
  Filled 2020-11-05: qty 1

## 2020-11-05 MED ORDER — LIDOCAINE HCL (PF) 2 % IJ SOLN
INTRAMUSCULAR | Status: AC
  Filled 2020-11-05: qty 5

## 2020-11-05 MED ORDER — ORAL CARE MOUTH RINSE: 15.0000 mL | Freq: Once | OROMUCOSAL | Status: AC

## 2020-11-05 MED ORDER — OXYCODONE HCL 5 MG/5ML PO SOLN: 5.0000 mg | Freq: Once | ORAL | Status: AC | PRN

## 2020-11-05 MED ORDER — GABAPENTIN 300 MG PO CAPS
ORAL_CAPSULE | ORAL | Status: AC
  Administered 2020-11-05: 600 mg via ORAL
  Filled 2020-11-05: qty 2

## 2020-11-05 MED ORDER — GABAPENTIN 300 MG PO CAPS: 600.0000 mg | ORAL_CAPSULE | ORAL | Status: AC

## 2020-11-05 MED ORDER — ONDANSETRON HCL 4 MG/2ML IJ SOLN: 4.0000 mg | Freq: Once | INTRAMUSCULAR | Status: AC

## 2020-11-05 MED ORDER — CEFAZOLIN SODIUM-DEXTROSE 2-4 GM/100ML-% IV SOLN
INTRAVENOUS | Status: AC
  Filled 2020-11-05: qty 100

## 2020-11-05 MED ORDER — DEXAMETHASONE SODIUM PHOSPHATE 10 MG/ML IJ SOLN
INTRAMUSCULAR | Status: DC | PRN
  Administered 2020-11-05: 10 mg via INTRAVENOUS

## 2020-11-05 MED ORDER — LACTATED RINGERS IV SOLN: INTRAVENOUS | Status: DC

## 2020-11-05 MED ORDER — DEXAMETHASONE SODIUM PHOSPHATE 10 MG/ML IJ SOLN
4.0000 mg | INTRAMUSCULAR | Status: AC
  Administered 2020-11-05: 4 mg via INTRAVENOUS

## 2020-11-05 MED ORDER — KETOROLAC TROMETHAMINE 15 MG/ML IJ SOLN
INTRAMUSCULAR | Status: AC
  Administered 2020-11-05: 15 mg via INTRAVENOUS
  Filled 2020-11-05: qty 1

## 2020-11-05 MED ORDER — MIDAZOLAM HCL 2 MG/2ML IJ SOLN
INTRAMUSCULAR | Status: AC
  Filled 2020-11-05: qty 2

## 2020-11-05 MED ORDER — DEXAMETHASONE SODIUM PHOSPHATE 10 MG/ML IJ SOLN
INTRAMUSCULAR | Status: AC
  Filled 2020-11-05: qty 1

## 2020-11-05 MED ORDER — PROPOFOL 10 MG/ML IV BOLUS
INTRAVENOUS | Status: DC | PRN
  Administered 2020-11-05: 160 mg via INTRAVENOUS

## 2020-11-05 MED ORDER — ONDANSETRON HCL 4 MG/2ML IJ SOLN
INTRAMUSCULAR | Status: DC | PRN
  Administered 2020-11-05: 4 mg via INTRAVENOUS

## 2020-11-05 MED ORDER — SUGAMMADEX SODIUM 200 MG/2ML IV SOLN
INTRAVENOUS | Status: DC | PRN
  Administered 2020-11-05: 200 mg via INTRAVENOUS

## 2020-11-05 MED ORDER — PROPOFOL 10 MG/ML IV BOLUS
INTRAVENOUS | Status: AC
  Filled 2020-11-05: qty 20

## 2020-11-05 MED ORDER — OXYCODONE HCL 5 MG PO TABS
ORAL_TABLET | ORAL | Status: AC
  Filled 2020-11-05: qty 1

## 2020-11-05 MED ORDER — ENSURE PRE-SURGERY PO LIQD
296.0000 mL | Freq: Once | ORAL | Status: AC
  Administered 2020-11-05: 296 mL via ORAL
  Filled 2020-11-05: qty 296

## 2020-11-05 MED ORDER — CHLORHEXIDINE GLUCONATE 0.12 % MT SOLN
OROMUCOSAL | Status: AC
  Administered 2020-11-05: 15 mL via OROMUCOSAL
  Filled 2020-11-05: qty 15

## 2020-11-05 MED ORDER — FAMOTIDINE 20 MG PO TABS
ORAL_TABLET | ORAL | Status: AC
  Administered 2020-11-05: 20 mg via ORAL
  Filled 2020-11-05: qty 1

## 2020-11-05 MED ORDER — POVIDONE-IODINE 10 % EX SWAB
2.0000 "application " | Freq: Once | CUTANEOUS | Status: AC
  Administered 2020-11-05: 2 via TOPICAL

## 2020-11-05 MED ORDER — FENTANYL CITRATE (PF) 100 MCG/2ML IJ SOLN
25.0000 ug | INTRAMUSCULAR | Status: DC | PRN
  Administered 2020-11-05: 25 ug via INTRAVENOUS
  Administered 2020-11-05: 50 ug via INTRAVENOUS
  Administered 2020-11-05: 25 ug via INTRAVENOUS

## 2020-11-05 MED ORDER — ONDANSETRON 4 MG PO TBDP: 4.0000 mg | ORAL_TABLET | Freq: Three times a day (TID) | ORAL | 1 refills | Status: DC | PRN

## 2020-11-05 MED ORDER — CHLORHEXIDINE GLUCONATE 0.12 % MT SOLN: 15.0000 mL | Freq: Once | OROMUCOSAL | Status: AC

## 2020-11-05 MED ORDER — SCOPOLAMINE 1 MG/3DAYS TD PT72
MEDICATED_PATCH | TRANSDERMAL | Status: AC
  Administered 2020-11-05: 1.5 mg via TRANSDERMAL
  Filled 2020-11-05: qty 1

## 2020-11-05 MED ORDER — SCOPOLAMINE 1 MG/3DAYS TD PT72: 1.0000 | MEDICATED_PATCH | TRANSDERMAL | Status: DC

## 2020-11-05 MED ORDER — OXYCODONE HCL 5 MG PO TABS
5.0000 mg | ORAL_TABLET | Freq: Once | ORAL | Status: AC | PRN
  Administered 2020-11-05: 5 mg via ORAL

## 2020-11-05 MED ORDER — LIDOCAINE HCL (CARDIAC) PF 100 MG/5ML IV SOSY
PREFILLED_SYRINGE | INTRAVENOUS | Status: DC | PRN
  Administered 2020-11-05: 80 mg via INTRAVENOUS

## 2020-11-05 MED ORDER — ROCURONIUM BROMIDE 100 MG/10ML IV SOLN
INTRAVENOUS | Status: DC | PRN
  Administered 2020-11-05: 50 mg via INTRAVENOUS
  Administered 2020-11-05: 20 mg via INTRAVENOUS

## 2020-11-05 MED ORDER — FENTANYL CITRATE (PF) 100 MCG/2ML IJ SOLN
INTRAMUSCULAR | Status: DC | PRN
  Administered 2020-11-05: 25 ug via INTRAVENOUS
  Administered 2020-11-05: 50 ug via INTRAVENOUS
  Administered 2020-11-05: 25 ug via INTRAVENOUS

## 2020-11-05 MED ORDER — ACETAMINOPHEN 500 MG PO TABS: 1000.0000 mg | ORAL_TABLET | ORAL | Status: AC

## 2020-11-05 MED ORDER — MECLIZINE HCL 12.5 MG PO TABS
12.5000 mg | ORAL_TABLET | Freq: Once | ORAL | Status: AC
  Administered 2020-11-05: 12.5 mg via ORAL
  Filled 2020-11-05: qty 1

## 2020-11-05 MED ORDER — OXYCODONE HCL 5 MG PO TABS
5.0000 mg | ORAL_TABLET | ORAL | 0 refills | Status: AC | PRN
Start: 2020-11-05 — End: 2021-11-05

## 2020-11-05 MED ORDER — IBUPROFEN 800 MG PO TABS
800.0000 mg | ORAL_TABLET | Freq: Four times a day (QID) | ORAL | 0 refills | Status: DC
Start: 2020-11-05 — End: 2024-03-16

## 2020-11-05 MED ORDER — ACETAMINOPHEN 500 MG PO TABS
1000.0000 mg | ORAL_TABLET | Freq: Four times a day (QID) | ORAL | 0 refills | Status: DC
Start: 2020-11-05 — End: 2024-03-16

## 2020-11-05 MED ORDER — ACETAMINOPHEN 500 MG PO TABS
ORAL_TABLET | ORAL | Status: AC
  Administered 2020-11-05: 1000 mg via ORAL
  Filled 2020-11-05: qty 2

## 2020-11-05 MED ORDER — HEPARIN SODIUM (PORCINE) 5000 UNIT/ML IJ SOLN: 5000.0000 [IU] | INTRAMUSCULAR | Status: AC

## 2020-11-05 MED ORDER — MIDAZOLAM HCL 2 MG/2ML IJ SOLN
INTRAMUSCULAR | Status: DC | PRN
  Administered 2020-11-05: 2 mg via INTRAVENOUS

## 2020-11-05 MED ORDER — CEFAZOLIN SODIUM-DEXTROSE 2-4 GM/100ML-% IV SOLN
2.0000 g | INTRAVENOUS | Status: AC
  Administered 2020-11-05: 2 g via INTRAVENOUS

## 2020-11-05 MED ORDER — ONDANSETRON HCL 4 MG/2ML IJ SOLN
INTRAMUSCULAR | Status: AC
  Administered 2020-11-05: 4 mg
  Filled 2020-11-05: qty 2

## 2020-11-05 MED ORDER — MEPERIDINE HCL 50 MG/ML IJ SOLN: 6.2500 mg | INTRAMUSCULAR | Status: DC | PRN

## 2020-11-05 MED ORDER — KETOROLAC TROMETHAMINE 15 MG/ML IJ SOLN: 15.0000 mg | INTRAMUSCULAR | Status: AC

## 2020-11-05 SURGICAL SUPPLY — 63 items
ADH SKN CLS APL DERMABOND .7 (GAUZE/BANDAGES/DRESSINGS) ×1
APL PRP STRL LF DISP 70% ISPRP (MISCELLANEOUS) ×2
APL SRG 38 LTWT LNG FL B (MISCELLANEOUS)
APPLICATOR ARISTA FLEXITIP XL (MISCELLANEOUS) IMPLANT
BACTOSHIELD CHG 4% 4OZ (MISCELLANEOUS) ×2
BAG DRN RND TRDRP ANRFLXCHMBR (UROLOGICAL SUPPLIES) ×1
BAG URINE DRAIN 2000ML AR STRL (UROLOGICAL SUPPLIES) ×3 IMPLANT
BINDER ABDOMINAL  9 SM 30-45 (SOFTGOODS) ×2
BINDER ABDOMINAL 9 SM 30-45 (SOFTGOODS) ×1 IMPLANT
BLADE SURG SZ11 CARB STEEL (BLADE) ×3 IMPLANT
CANISTER SUCT 1200ML W/VALVE (MISCELLANEOUS) ×3 IMPLANT
CATH FOLEY 2WAY  5CC 16FR (CATHETERS) ×2
CATH FOLEY 2WAY 5CC 16FR (CATHETERS) ×1
CATH URTH 16FR FL 2W BLN LF (CATHETERS) ×1 IMPLANT
CHLORAPREP W/TINT 26 (MISCELLANEOUS) ×6 IMPLANT
COUNTER NEEDLE 20/40 LG (NEEDLE) ×3 IMPLANT
COVER WAND RF STERILE (DRAPES) ×3 IMPLANT
DEFOGGER SCOPE WARMER CLEARIFY (MISCELLANEOUS) ×3 IMPLANT
DERMABOND ADVANCED (GAUZE/BANDAGES/DRESSINGS) ×2
DERMABOND ADVANCED .7 DNX12 (GAUZE/BANDAGES/DRESSINGS) ×1 IMPLANT
DEVICE SUTURE ENDOST 10MM (ENDOMECHANICALS) IMPLANT
DRAPE 3/4 80X56 (DRAPES) ×3 IMPLANT
DRAPE LEGGINS SURG 28X43 STRL (DRAPES) ×3 IMPLANT
DRAPE UNDER BUTTOCK W/FLU (DRAPES) ×3 IMPLANT
ELECT REM PT RETURN 9FT ADLT (ELECTROSURGICAL) ×3
ELECTRODE REM PT RTRN 9FT ADLT (ELECTROSURGICAL) ×1 IMPLANT
GAUZE SPONGE 4X4 16PLY XRAY LF (GAUZE/BANDAGES/DRESSINGS) ×3 IMPLANT
GLOVE PI ORTHOPRO 6.5 (GLOVE) ×12
GLOVE PI ORTHOPRO STRL 6.5 (GLOVE) ×6 IMPLANT
GLOVE SURG SYN 6.5 ES PF (GLOVE) ×18 IMPLANT
GOWN STRL REUS W/ TWL LRG LVL3 (GOWN DISPOSABLE) ×3 IMPLANT
GOWN STRL REUS W/ TWL XL LVL3 (GOWN DISPOSABLE) ×1 IMPLANT
GOWN STRL REUS W/TWL LRG LVL3 (GOWN DISPOSABLE) ×9
GOWN STRL REUS W/TWL XL LVL3 (GOWN DISPOSABLE) ×3
HANDLE YANKAUER SUCT BULB TIP (MISCELLANEOUS) ×3 IMPLANT
HEMOSTAT ARISTA ABSORB 3G PWDR (HEMOSTASIS) ×3 IMPLANT
IRRIGATION STRYKERFLOW (MISCELLANEOUS) IMPLANT
IRRIGATOR STRYKERFLOW (MISCELLANEOUS)
IV LACTATED RINGERS 1000ML (IV SOLUTION) IMPLANT
KIT PINK PAD W/HEAD ARE REST (MISCELLANEOUS) ×3
KIT PINK PAD W/HEAD ARM REST (MISCELLANEOUS) ×1 IMPLANT
KIT TURNOVER CYSTO (KITS) ×3 IMPLANT
L-HOOK LAP DISP 36CM (ELECTROSURGICAL) ×3
LHOOK LAP DISP 36CM (ELECTROSURGICAL) ×1 IMPLANT
LIGASURE VESSEL 5MM BLUNT TIP (ELECTROSURGICAL) ×3 IMPLANT
MANIFOLD NEPTUNE II (INSTRUMENTS) ×3 IMPLANT
MANIPULATOR VCARE LG CRV RETR (MISCELLANEOUS) ×3 IMPLANT
NS IRRIG 500ML POUR BTL (IV SOLUTION) ×3 IMPLANT
PACK LAP CHOLECYSTECTOMY (MISCELLANEOUS) ×3 IMPLANT
PAD OB MATERNITY 4.3X12.25 (PERSONAL CARE ITEMS) ×3 IMPLANT
PAD PREP 24X41 OB/GYN DISP (PERSONAL CARE ITEMS) ×3 IMPLANT
PENCIL ELECTRO HAND CTR (MISCELLANEOUS) ×3 IMPLANT
SCRUB CHG 4% DYNA-HEX 4OZ (MISCELLANEOUS) ×1 IMPLANT
SET CYSTO W/LG BORE CLAMP LF (SET/KITS/TRAYS/PACK) IMPLANT
SLEEVE ENDOPATH XCEL 5M (ENDOMECHANICALS) ×6 IMPLANT
SURGILUBE 2OZ TUBE FLIPTOP (MISCELLANEOUS) ×3 IMPLANT
SUT ENDO VLOC 180-0-8IN (SUTURE) IMPLANT
SUT MNCRL 4-0 (SUTURE) ×3
SUT MNCRL 4-0 27XMFL (SUTURE) ×1
SUT VIC AB 0 CT1 36 (SUTURE) ×6 IMPLANT
SUTURE MNCRL 4-0 27XMF (SUTURE) ×1 IMPLANT
TROCAR XCEL NON-BLD 5MMX100MML (ENDOMECHANICALS) ×3 IMPLANT
TUBING EVAC SMOKE HEATED PNEUM (TUBING) ×3 IMPLANT

## 2020-11-05 NOTE — Progress Notes (Signed)
Pregnancy test negative

## 2020-11-05 NOTE — Transfer of Care (Signed)
Immediate Anesthesia Transfer of Care Note  Patient: DAISIE HAFT  Procedure(s) Performed: TOTAL LAPAROSCOPIC HYSTERECTOMY WITH  BILATERAL  SALPINGECTOMY (Bilateral )  Patient Location: PACU  Anesthesia Type:General  Level of Consciousness: awake and drowsy  Airway & Oxygen Therapy: Patient Spontanous Breathing and Patient connected to face mask oxygen  Post-op Assessment: Report given to RN and Post -op Vital signs reviewed and stable  Post vital signs: Reviewed and stable  Last Vitals:  Vitals Value Taken Time  BP 117/60 11/05/20 1005  Temp 36.1 C 11/05/20 1005  Pulse 79 11/05/20 1008  Resp 20 11/05/20 1008  SpO2 100 % 11/05/20 1008  Vitals shown include unvalidated device data.  Last Pain:  Vitals:   11/05/20 1005  TempSrc:   PainSc: 0-No pain         Complications: No complications documented.

## 2020-11-05 NOTE — Interval H&P Note (Signed)
History and Physical Interval Note:  11/05/2020 7:56 AM  Donna Mejia  has presented today for surgery, with the diagnosis of Menorrhagia.  The various methods of treatment have been discussed with the patient and family. After consideration of risks, benefits and other options for treatment, the patient has consented to  Procedure(s): TOTAL LAPAROSCOPIC HYSTERECTOMY WITH  BILATERAL  SALPINGECTOMY (Bilateral) as a surgical intervention.  The patient's history has been reviewed, patient examined, no change in status, stable for surgery.  I have reviewed the patient's chart and labs.  Questions were answered to the patient's satisfaction.     [place]

## 2020-11-05 NOTE — Anesthesia Postprocedure Evaluation (Signed)
Anesthesia Post Note  Patient: Donna Mejia  Procedure(s) Performed: TOTAL LAPAROSCOPIC HYSTERECTOMY WITH  BILATERAL  SALPINGECTOMY (Bilateral )  Patient location during evaluation: PACU Anesthesia Type: General Level of consciousness: awake and alert and oriented Pain management: pain level controlled Vital Signs Assessment: post-procedure vital signs reviewed and stable Respiratory status: spontaneous breathing, nonlabored ventilation and respiratory function stable Cardiovascular status: blood pressure returned to baseline and stable Postop Assessment: no signs of nausea or vomiting Anesthetic complications: no   No complications documented.   Last Vitals:  Vitals:   11/05/20 1109 11/05/20 1125  BP:  122/77  Pulse: 79 74  Resp: 14 16  Temp: (!) 36 C 36.6 C  SpO2: 100% 98%    Last Pain:  Vitals:   11/05/20 1125  TempSrc: Oral  PainSc: 4                  Shariyah Eland

## 2020-11-05 NOTE — Anesthesia Procedure Notes (Signed)
Procedure Name: Intubation Date/Time: 11/05/2020 8:20 AM Performed by: Allean Found, CRNA Pre-anesthesia Checklist: Patient identified, Patient being monitored, Timeout performed, Emergency Drugs available and Suction available Patient Re-evaluated:Patient Re-evaluated prior to induction Oxygen Delivery Method: Circle system utilized Preoxygenation: Pre-oxygenation with 100% oxygen Induction Type: IV induction Ventilation: Mask ventilation without difficulty Laryngoscope Size: Mac, McGraph and 3 Grade View: Grade I Tube type: Oral Tube size: 7.0 mm Number of attempts: 1 Airway Equipment and Method: Stylet Placement Confirmation: ETT inserted through vocal cords under direct vision,  positive ETCO2 and breath sounds checked- equal and bilateral Secured at: 21 cm Tube secured with: Tape Dental Injury: Teeth and Oropharynx as per pre-operative assessment

## 2020-11-05 NOTE — Anesthesia Preprocedure Evaluation (Signed)
Anesthesia Evaluation  Patient identified by MRN, date of birth, ID band Patient awake    Reviewed: Allergy & Precautions, NPO status , Patient's Chart, lab work & pertinent test results  History of Anesthesia Complications (+) PONV and history of anesthetic complications  Airway Mallampati: II  TM Distance: >3 FB Neck ROM: Full    Dental no notable dental hx.    Pulmonary neg pulmonary ROS, neg sleep apnea, neg COPD,    breath sounds clear to auscultation- rhonchi (-) wheezing      Cardiovascular Exercise Tolerance: Good (-) hypertension(-) CAD, (-) Past MI, (-) Cardiac Stents and (-) CABG  Rhythm:Regular Rate:Normal - Systolic murmurs and - Diastolic murmurs    Neuro/Psych neg Seizures Anxiety negative neurological ROS     GI/Hepatic negative GI ROS, Neg liver ROS,   Endo/Other  neg diabetesHyperthyroidism   Renal/GU negative Renal ROS     Musculoskeletal negative musculoskeletal ROS (+)   Abdominal (+) - obese,   Peds  Hematology negative hematology ROS (+)   Anesthesia Other Findings Past Medical History: No date: Anxiety No date: Dysmenorrhea No date: Family history of adverse reaction to anesthesia     Comment:  MOM-N/V No date: Hyperthyroidism No date: Hypothyroidism No date: IMB (intermenstrual bleeding) No date: PCOD (polycystic ovarian disease) No date: Pelvic pain in female No date: PONV (postoperative nausea and vomiting)     Comment:  NAUSEA AFTER BREAST SURGERY   Reproductive/Obstetrics                            Anesthesia Physical Anesthesia Plan  ASA: II  Anesthesia Plan: General   Post-op Pain Management:    Induction: Intravenous  PONV Risk Score and Plan: 3 and Ondansetron, Dexamethasone, Propofol infusion, Scopolamine patch - Pre-op and Midazolam  Airway Management Planned: Oral ETT  Additional Equipment:   Intra-op Plan:   Post-operative Plan:  Extubation in OR  Informed Consent: I have reviewed the patients History and Physical, chart, labs and discussed the procedure including the risks, benefits and alternatives for the proposed anesthesia with the patient or authorized representative who has indicated his/her understanding and acceptance.     Dental advisory given  Plan Discussed with: CRNA and Anesthesiologist  Anesthesia Plan Comments:         Anesthesia Quick Evaluation

## 2020-11-05 NOTE — Progress Notes (Signed)
Spoke with doctor [place] regarding pt feeling like she spinning every time she opens her eyes, MEclizine ordered

## 2020-11-05 NOTE — Discharge Instructions (Addendum)
Discharge instructions:  Call office if you have any of the following: fever >101 F, chills, shortness of breath, excessive vaginal bleeding, incision drainage or problems, leg pain or redness, or any other concerns.   Activity: Do not lift > 20 lbs for 8 weeks.  No intercourse or tampons for 8 weeks.  No driving until you are certain you can slam on the brakes, and of course never while taking narcotics.   You may feel some pain in your upper right abdomen/rib and right shoulder.  This is from the gas in the abdomen for surgery. This will subside over time, please be patient!  Take 800mg  Ibuprofen and 1000mg  Tylenol together, around the clock, every 6 hours for at least the first 3-5 days.  After this you can take as needed.  This will help decrease inflammation and promote healing.  The narcotics you'll take just as needed, as they just trick your brain into thinking its not in pain.  Keep the abdominal binder on as long as you can, as much as you can every day for at least the first week.  It is phenomenally helpful for pain control.  Please don't limit yourself in terms of routine activity.  You will be able to do most things, although they may take longer to do or be a little painful.  You can do it!  Don't be a hero, but don't be a wimp either!    AMBULATORY SURGERY  DISCHARGE INSTRUCTIONS   1) The drugs that you were given will stay in your system until tomorrow so for the next 24 hours you should not:  A) Drive an automobile B) Make any legal decisions C) Drink any alcoholic beverage   2) You may resume regular meals tomorrow.  Today it is better to start with liquids and gradually work up to solid foods.  You may eat anything you prefer, but it is better to start with liquids, then soup and crackers, and gradually work up to solid foods.   3) Please notify your doctor immediately if you have any unusual bleeding, trouble breathing, redness and pain at the surgery site,  drainage, fever, or pain not relieved by medication.    4) Additional Instructions:        Please contact your physician with any problems or Same Day Surgery at [phone], Monday through Friday 6 am to 4 pm, or [place] at [facility] number at [phone].

## 2020-11-05 NOTE — Progress Notes (Signed)
Per pharmacy pt is okay to receive ancef with penicillin allergy of rash

## 2020-11-05 NOTE — Op Note (Signed)
Total Laparoscopic Hysterectomy Operative Note Procedure Date: 11/05/2020  Patient:  Donna Mejia  39 y.o. female  PRE-OPERATIVE DIAGNOSIS:  Menorrhagia  POST-OPERATIVE DIAGNOSIS:  Menorrhagia, stage 1 endometriosis, pelvic congestion  PROCEDURE:  Procedure(s): TOTAL LAPAROSCOPIC HYSTERECTOMY WITH  BILATERAL  SALPINGECTOMY (Bilateral)  SURGEON:  Surgeon(s) and Role:    * Kahli Mayon, Honor Loh, MD - Primary    * Benjaman Kindler, MD - Assisting  ANESTHESIA:  General via ET  I/O  Total I/O In: 1000 [I.V.:1000] Out: 700 [Urine:600; Blood:100]  FINDINGS:  Small uterus, normal ovaries and fallopian tubes bilaterally.  Normal upper abdomen. Evidence of endometriosis in the posterior culdesac, with brown hemosiderin deposits and some scarring of the right uterosacral ligament. The uterine vessels were dilated and redundant, consistent with pelvic congestion syndrome.  SPECIMEN: Uterus, Cervix, and bilateral fallopian tubes  COMPLICATIONS: none apparent  DISPOSITION: vital signs stable to PACU  Indication for Surgery: 39 y.o. [id number] with heavy menstrual bleeding and desire for definitive treatment.   Risks of surgery were discussed with the patient including but not limited to: bleeding which may require transfusion or reoperation; infection which may require antibiotics; injury to bowel, bladder, ureters or other surrounding organs; need for additional procedures including laparotomy, blood clot, incisional problems and other postoperative/anesthesia complications. Written informed consent was obtained.      PROCEDURE IN DETAIL:  The patient had 5000u Heparin Sub-q and sequential compression devices applied to her lower extremities while in the preoperative area.  She was then taken to the operating room. IV antibiotics were given. General anesthesia was administered via endotracheal route.  She was placed in the dorsal lithotomy position, and was prepped and draped in a sterile  manner. A surgical time-out was performed.  A Foley catheter was inserted into her bladder and attached to constant drainage and a V-Care uterine manipulator was then advanced into the uterus and a good fit around the cervix was noted. The gloves were changed, and attention was turned to the abdomen where an umbilical incision was made with the scalpel.  A 107mm trochar was inserted in the umbilical incision using a visiport method.Opening pressure was 9mHg, and the abdomen was insufflated to 25mmHg carbon dioxide gas and adequate pneumoperitoneum was obtained. A survey of the patient's pelvis and abdomen revealed the findings as mentioned above. Two 37mm ports were inserted in the lower left and right quadrants under visualization.    The bilateral fallopian tubes were separated from the mesosalpinx using the Ligasure. The bilateral round ligaments were transected and anterior broad ligament divided and brought across the uterus to separate the vesicouterine peritoneum and create a bladder flap. The bladder was pushed away from the uterus. The bilateral uterine arteries were skeletonized, ligated and transected. The bilateral uterosacral and cardinal ligaments were ligated and transected. A colpotomy was made around the V-Care cervical cup and the uterus, cervix, and bilateral tubes were removed through the vagina. The vaginal cuff was closed vaginally using 0-Vicryl in a running locking stitch. This was tested for integrity using the surgeon's finger. After a change of gloves, the pneumoperitoneum was recreated and surgical site inspected, and found to be hemostatic. Bilateral ureters were visualized vermiuclating. No intraoperative injury to surrounding organs was noted. The abdomen was desufflated and all instruments were then removed.   All skin incisions were closed with 4-0 monocryl and covered with surgical glue. The patient tolerated the procedures well.  All instruments, needles, and sponge  counts were correct x 2. The patient was  taken to the recovery room in stable condition.   ---- Larey Days, MD Attending Obstetrician and [facility]

## 2020-11-06 ENCOUNTER — Encounter: Payer: Self-pay | Admitting: Obstetrics & Gynecology

## 2020-11-06 LAB — SURGICAL PATHOLOGY

## 2020-11-12 ENCOUNTER — Encounter: Payer: 59 | Admitting: Certified Nurse Midwife

## 2021-01-09 ENCOUNTER — Other Ambulatory Visit: Payer: Self-pay | Admitting: Obstetrics & Gynecology

## 2021-05-13 ENCOUNTER — Other Ambulatory Visit: Payer: Self-pay

## 2021-05-13 DIAGNOSIS — Z1231 Encounter for screening mammogram for malignant neoplasm of breast: Secondary | ICD-10-CM

## 2021-05-22 ENCOUNTER — Other Ambulatory Visit: Payer: Self-pay

## 2021-05-22 ENCOUNTER — Ambulatory Visit
Admission: RE | Admit: 2021-05-22 | Discharge: 2021-05-22 | Disposition: A | Discharge status: Home / Self Care | Payer: 59 | Source: Ambulatory Visit

## 2021-05-22 DIAGNOSIS — Z1231 Encounter for screening mammogram for malignant neoplasm of breast: Secondary | ICD-10-CM | POA: Diagnosis present

## 2022-05-22 ENCOUNTER — Other Ambulatory Visit: Payer: Self-pay

## 2022-05-22 DIAGNOSIS — Z1231 Encounter for screening mammogram for malignant neoplasm of breast: Secondary | ICD-10-CM

## 2022-07-02 ENCOUNTER — Ambulatory Visit
Admission: RE | Admit: 2022-07-02 | Discharge: 2022-07-02 | Disposition: A | Discharge status: Home / Self Care | Payer: 59 | Source: Ambulatory Visit

## 2022-07-02 DIAGNOSIS — Z1231 Encounter for screening mammogram for malignant neoplasm of breast: Secondary | ICD-10-CM | POA: Diagnosis present

## 2023-07-15 ENCOUNTER — Other Ambulatory Visit: Payer: Self-pay

## 2023-07-15 DIAGNOSIS — Z1231 Encounter for screening mammogram for malignant neoplasm of breast: Secondary | ICD-10-CM

## 2023-07-30 ENCOUNTER — Ambulatory Visit
Admission: RE | Admit: 2023-07-30 | Discharge: 2023-07-30 | Disposition: A | Discharge status: Home / Self Care | Payer: 59 | Source: Ambulatory Visit

## 2023-07-30 DIAGNOSIS — Z1231 Encounter for screening mammogram for malignant neoplasm of breast: Secondary | ICD-10-CM | POA: Diagnosis present

## 2023-10-21 ENCOUNTER — Other Ambulatory Visit: Payer: Self-pay | Admitting: Orthopedic Surgery

## 2023-10-21 ENCOUNTER — Encounter: Payer: Self-pay | Admitting: Orthopedic Surgery

## 2023-10-21 DIAGNOSIS — M5030 Other cervical disc degeneration, unspecified cervical region: Secondary | ICD-10-CM

## 2023-10-21 DIAGNOSIS — M5412 Radiculopathy, cervical region: Secondary | ICD-10-CM

## 2023-10-25 ENCOUNTER — Other Ambulatory Visit: Payer: 59

## 2023-11-21 ENCOUNTER — Ambulatory Visit
Admission: RE | Admit: 2023-11-21 | Discharge: 2023-11-21 | Disposition: A | Discharge status: Home / Self Care | Payer: 59 | Source: Ambulatory Visit | Attending: Orthopedic Surgery | Admitting: Orthopedic Surgery

## 2023-11-21 DIAGNOSIS — M5412 Radiculopathy, cervical region: Secondary | ICD-10-CM

## 2023-11-21 DIAGNOSIS — M5030 Other cervical disc degeneration, unspecified cervical region: Secondary | ICD-10-CM

## 2024-03-07 ENCOUNTER — Inpatient Hospital Stay
Admission: RE | Admit: 2024-03-07 | Discharge: 2024-03-07 | Disposition: A | Discharge status: Home / Self Care | Payer: Self-pay | Source: Ambulatory Visit | Attending: Neurosurgery | Admitting: Neurosurgery

## 2024-03-07 ENCOUNTER — Other Ambulatory Visit: Payer: Self-pay | Admitting: Family Medicine

## 2024-03-07 DIAGNOSIS — Z049 Encounter for examination and observation for unspecified reason: Secondary | ICD-10-CM

## 2024-03-09 NOTE — Progress Notes (Signed)
 Referring Physician:  Elijah Birk, MD [address] [place],  [place] [address]  Primary Physician:  Patient, No Pcp Per  History of Present Illness: 03/15/2024 Ms. Donna Mejia is here today with a chief complaint of left arm pain for the past 8 months.  She began having pain in August 2024 into her left shoulder blade and left arm.  She now has discomfort into her left hand with numbness in her first finger and thumb.  She has pain much of the time but is worsened with activities.  Chores, gardening, and holding things make it worse.  Laying down does make her pain better.  She has tried physical therapy without improvement.  She has had minimal temporary improvement with injections.  She has tried NSAIDs for more than 6 weeks.  8/24 - pain into neck and L shoulder blade  Bowel/Bladder Dysfunction: none  Conservative measures: chiropractor, dry needling Physical therapy: has participated in at [facility] in [place] Multimodal medical therapy including regular antiinflammatories:  tylenol, ibuprofen Injections:   02/01/24: Left C5-6 TF ESI (50% relief) 01/11/24: Left C6-7 TF ESI (75% relief)  Past Surgery: no spinal surgeries   Donna Mejia has no symptoms of cervical myelopathy.  The symptoms are causing a significant impact on the patient's life.   I have utilized the care everywhere function in epic to review the outside records available from external health systems.  Review of Systems:  A 10 point review of systems is negative, except for the pertinent positives and negatives detailed in the HPI.  Past Medical History: Past Medical History:  Diagnosis Date   Anxiety    Dysmenorrhea    Family history of adverse reaction to anesthesia    MOM-N/V   Hyperthyroidism    Hypothyroidism    IMB (intermenstrual bleeding)    PCOD (polycystic ovarian disease)    Pelvic pain in female    PONV (postoperative nausea and vomiting)    NAUSEA AFTER BREAST SURGERY     Past Surgical History: Past Surgical History:  Procedure Laterality Date   BREAST BIOPSY Left 11/19/2018   Sclerosing lesion   BREAST BIOPSY Left 12/03/2018   Procedure: BREAST BIOPSY WITH NEEDLE LOCALIZATION;  Surgeon: Carolan Shiver, MD;  Location: [facility] ORS;  Service: General;  Laterality: Left;   BREAST EXCISIONAL BIOPSY Left 12/03/2018   Sclerosing lesion   BREAST LUMPECTOMY Left    sclerosing lesion   DILATION AND CURETTAGE OF UTERUS     HYSTEROSCOPY  08/2010   secretory endometrium   TOTAL LAPAROSCOPIC HYSTERECTOMY WITH SALPINGECTOMY Bilateral 11/05/2020   Procedure: TOTAL LAPAROSCOPIC HYSTERECTOMY WITH  BILATERAL  SALPINGECTOMY;  Surgeon: Leola Brazil, MD;  Location: [facility] ORS;  Service: Gynecology;  Laterality: Bilateral;    Allergies: Allergies as of 03/15/2024 - Review Complete 03/15/2024  Allergen Reaction Noted   Levaquin [levofloxacin] Rash 10/29/2020   Penicillins Rash 07/30/2015    Medications:  Current Outpatient Medications:    acetaminophen (TYLENOL) 500 MG tablet, Take 2 tablets (1,000 mg total) by mouth every 6 (six) hours., Disp: 30 tablet, Rfl: 0   cetirizine (ZYRTEC) 10 MG tablet, Take 10 mg by mouth daily as needed for allergies., Disp: , Rfl:    ibuprofen (ADVIL) 800 MG tablet, Take 1 tablet (800 mg total) by mouth every 6 (six) hours., Disp: 31 tablet, Rfl: 0   methimazole (TAPAZOLE) 5 MG tablet, Take 2.5 mg by mouth every morning. , Disp: , Rfl:   Social History: Social History  Tobacco Use   Smoking status: Never   Smokeless tobacco: Never  Vaping Use   Vaping status: Never Used  Substance Use Topics   Alcohol use: No   Drug use: No    Family Medical History: Family History  Problem Relation Age of Onset   Endometriosis Mother    Heart disease Mother    Diabetes Father    Heart disease Maternal Uncle    Heart disease Maternal Grandmother    Cancer Paternal Grandmother        colon   Diabetes Paternal Grandmother     Breast cancer Cousin        maternal    Physical Examination: Vitals:   03/15/24 1355  BP: (!) 148/96    General: Patient is in no apparent distress. Attention to examination is appropriate.  Neck:   Supple.  Full range of motion.  Respiratory: Patient is breathing without any difficulty.   NEUROLOGICAL:     Awake, alert, oriented to person, place, and time.  Speech is clear and fluent.   Cranial Nerves: Pupils equal round and reactive to light.  Facial tone is symmetric.  Facial sensation is symmetric. Shoulder shrug is symmetric. Tongue protrusion is midline.  There is no pronator drift.  Strength: Side Biceps Triceps Deltoid Interossei Grip Wrist Ext. Wrist Flex.  R 5 5 5 5 5 5 5   L 5 5 5 5 5 5 5    Side Iliopsoas Quads Hamstring PF DF EHL  R 5 5 5 5 5 5   L 5 5 5 5 5 5    Reflexes are 1+ and symmetric at the biceps, triceps, brachioradialis, patella and achilles.   Hoffman's is absent.   Bilateral upper and lower extremity sensation is intact to light touch.    No evidence of dysmetria noted.  Gait is normal.     Medical Decision Making  Imaging: MRI C spine 11/21/2023 Disc levels:   C2-C3: Unremarkable.   C3-C4: Small central disc protrusion indents the ventral thecal sac (series 110, image 11). Superimposed mild left-sided uncovertebral and facet hypertrophy. No significant spinal stenosis. Borderline mild left C4 foraminal narrowing. Right neural foramen remains widely patent.   C4-C5: Small central disc protrusion indents the ventral thecal sac (series 110, image 15). No significant spinal stenosis. Foramina remain patent.   C5-C6: Degenerative intervertebral disc space narrowing with diffuse disc osteophyte complex, slightly asymmetric to the right. Posterior component flattens and partially faces the ventral thecal sac. Minimal cord flattening without cord signal changes. Mild spinal stenosis. Left greater than right uncovertebral spurring  with resultant mild to moderate left C6 foraminal stenosis. Right neural foramen remains patent.   C6-C7: Diffuse disc bulge with bilateral uncovertebral spurring, slightly eccentric to the left. Flattening and partial effacement of the ventral thecal sac with resultant mild spinal stenosis. Mild left C7 foraminal stenosis. Right neural foramen remains patent.   C7-T1:  Unremarkable.   IMPRESSION: 1. 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 2. Disc bulge with uncovertebral spurring at C6-7 with resultant mild spinal stenosis, with mild left C7 foraminal narrowing. 3. Small central disc protrusions at C3-4 and C4-5 without significant stenosis or impingement.     Electronically Signed   By: Rise Mu M.D.   On: 11/21/2023 18:11  I have personally reviewed the images and agree with the above interpretation with the exception that I feel she has moderate left-sided foraminal stenosis at C5-6.Marland Kitchen  Assessment and Plan: Ms. Durney is a pleasant 43 y.o. female with left C6 radiculopathy that has persisted despite maximal conservative management.  She has moderate foraminal stenosis at C5-6 on the left.  She has exhausted conservative measures.  She is no longer a candidate for further conservative management.  I recommended C5-6 arthroplasty.  Showed her disc height is 3.7 mm.  She has no evidence of anterolisthesis or retrolisthesis.  She has a less than 11 degree segmental angulation at C5-6.  As such, she is an excellent candidate for cervical disc arthroplasty.  I discussed the planned procedure at length with the patient, including the risks, benefits, alternatives, and indications. The risks discussed include but are not limited to bleeding, infection, need for reoperation, spinal fluid leak, stroke, vision loss, anesthetic complication, coma, paralysis, and even death. We also  discussed the possibility of post-operative dysphagia, vocal cord paralysis, and the risk of adjacent segment disease in the future. I also described in detail that improvement was not guaranteed.  The patient expressed understanding of these risks, and asked that we proceed with surgery. I described the surgery in layman's terms, and gave ample opportunity for questions, which were answered to the best of my ability.   I spent a total of 30 minutes in this patient's care today. This time was spent reviewing pertinent records including imaging studies, obtaining and confirming history, performing a directed evaluation, formulating and discussing my recommendations, and documenting the visit within the medical record.      Thank you for involving me in the care of this patient.      Ulani Degrasse K. Myer Haff MD, [facility] Neurosurgery

## 2024-03-09 NOTE — H&P (View-Only) (Signed)
 Referring Physician:  Elijah Birk, MD [address] [place],  [place] [address]  Primary Physician:  Patient, No Pcp Per  History of Present Illness: 03/15/2024 Ms. Donna Mejia is here today with a chief complaint of left arm pain for the past 8 months.  She began having pain in August 2024 into her left shoulder blade and left arm.  She now has discomfort into her left hand with numbness in her first finger and thumb.  She has pain much of the time but is worsened with activities.  Chores, gardening, and holding things make it worse.  Laying down does make her pain better.  She has tried physical therapy without improvement.  She has had minimal temporary improvement with injections.  She has tried NSAIDs for more than 6 weeks.  8/24 - pain into neck and L shoulder blade  Bowel/Bladder Dysfunction: none  Conservative measures: chiropractor, dry needling Physical therapy: has participated in at [facility] in [place] Multimodal medical therapy including regular antiinflammatories:  tylenol, ibuprofen Injections:   02/01/24: Left C5-6 TF ESI (50% relief) 01/11/24: Left C6-7 TF ESI (75% relief)  Past Surgery: no spinal surgeries   Donna Mejia has no symptoms of cervical myelopathy.  The symptoms are causing a significant impact on the patient's life.   I have utilized the care everywhere function in epic to review the outside records available from external health systems.  Review of Systems:  A 10 point review of systems is negative, except for the pertinent positives and negatives detailed in the HPI.  Past Medical History: Past Medical History:  Diagnosis Date   Anxiety    Dysmenorrhea    Family history of adverse reaction to anesthesia    MOM-N/V   Hyperthyroidism    Hypothyroidism    IMB (intermenstrual bleeding)    PCOD (polycystic ovarian disease)    Pelvic pain in female    PONV (postoperative nausea and vomiting)    NAUSEA AFTER BREAST SURGERY     Past Surgical History: Past Surgical History:  Procedure Laterality Date   BREAST BIOPSY Left 11/19/2018   Sclerosing lesion   BREAST BIOPSY Left 12/03/2018   Procedure: BREAST BIOPSY WITH NEEDLE LOCALIZATION;  Surgeon: Carolan Shiver, MD;  Location: [facility] ORS;  Service: General;  Laterality: Left;   BREAST EXCISIONAL BIOPSY Left 12/03/2018   Sclerosing lesion   BREAST LUMPECTOMY Left    sclerosing lesion   DILATION AND CURETTAGE OF UTERUS     HYSTEROSCOPY  08/2010   secretory endometrium   TOTAL LAPAROSCOPIC HYSTERECTOMY WITH SALPINGECTOMY Bilateral 11/05/2020   Procedure: TOTAL LAPAROSCOPIC HYSTERECTOMY WITH  BILATERAL  SALPINGECTOMY;  Surgeon: Leola Brazil, MD;  Location: [facility] ORS;  Service: Gynecology;  Laterality: Bilateral;    Allergies: Allergies as of 03/15/2024 - Review Complete 03/15/2024  Allergen Reaction Noted   Levaquin [levofloxacin] Rash 10/29/2020   Penicillins Rash 07/30/2015    Medications:  Current Outpatient Medications:    acetaminophen (TYLENOL) 500 MG tablet, Take 2 tablets (1,000 mg total) by mouth every 6 (six) hours., Disp: 30 tablet, Rfl: 0   cetirizine (ZYRTEC) 10 MG tablet, Take 10 mg by mouth daily as needed for allergies., Disp: , Rfl:    ibuprofen (ADVIL) 800 MG tablet, Take 1 tablet (800 mg total) by mouth every 6 (six) hours., Disp: 31 tablet, Rfl: 0   methimazole (TAPAZOLE) 5 MG tablet, Take 2.5 mg by mouth every morning. , Disp: , Rfl:   Social History: Social History  Tobacco Use   Smoking status: Never   Smokeless tobacco: Never  Vaping Use   Vaping status: Never Used  Substance Use Topics   Alcohol use: No   Drug use: No    Family Medical History: Family History  Problem Relation Age of Onset   Endometriosis Mother    Heart disease Mother    Diabetes Father    Heart disease Maternal Uncle    Heart disease Maternal Grandmother    Cancer Paternal Grandmother        colon   Diabetes Paternal Grandmother     Breast cancer Cousin        maternal    Physical Examination: Vitals:   03/15/24 1355  BP: (!) 148/96    General: Patient is in no apparent distress. Attention to examination is appropriate.  Neck:   Supple.  Full range of motion.  Respiratory: Patient is breathing without any difficulty.   NEUROLOGICAL:     Awake, alert, oriented to person, place, and time.  Speech is clear and fluent.   Cranial Nerves: Pupils equal round and reactive to light.  Facial tone is symmetric.  Facial sensation is symmetric. Shoulder shrug is symmetric. Tongue protrusion is midline.  There is no pronator drift.  Strength: Side Biceps Triceps Deltoid Interossei Grip Wrist Ext. Wrist Flex.  R 5 5 5 5 5 5 5   L 5 5 5 5 5 5 5    Side Iliopsoas Quads Hamstring PF DF EHL  R 5 5 5 5 5 5   L 5 5 5 5 5 5    Reflexes are 1+ and symmetric at the biceps, triceps, brachioradialis, patella and achilles.   Hoffman's is absent.   Bilateral upper and lower extremity sensation is intact to light touch.    No evidence of dysmetria noted.  Gait is normal.     Medical Decision Making  Imaging: MRI C spine 11/21/2023 Disc levels:   C2-C3: Unremarkable.   C3-C4: Small central disc protrusion indents the ventral thecal sac (series 110, image 11). Superimposed mild left-sided uncovertebral and facet hypertrophy. No significant spinal stenosis. Borderline mild left C4 foraminal narrowing. Right neural foramen remains widely patent.   C4-C5: Small central disc protrusion indents the ventral thecal sac (series 110, image 15). No significant spinal stenosis. Foramina remain patent.   C5-C6: Degenerative intervertebral disc space narrowing with diffuse disc osteophyte complex, slightly asymmetric to the right. Posterior component flattens and partially faces the ventral thecal sac. Minimal cord flattening without cord signal changes. Mild spinal stenosis. Left greater than right uncovertebral spurring  with resultant mild to moderate left C6 foraminal stenosis. Right neural foramen remains patent.   C6-C7: Diffuse disc bulge with bilateral uncovertebral spurring, slightly eccentric to the left. Flattening and partial effacement of the ventral thecal sac with resultant mild spinal stenosis. Mild left C7 foraminal stenosis. Right neural foramen remains patent.   C7-T1:  Unremarkable.   IMPRESSION: 1. 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 2. Disc bulge with uncovertebral spurring at C6-7 with resultant mild spinal stenosis, with mild left C7 foraminal narrowing. 3. Small central disc protrusions at C3-4 and C4-5 without significant stenosis or impingement.     Electronically Signed   By: Rise Mu M.D.   On: 11/21/2023 18:11  I have personally reviewed the images and agree with the above interpretation with the exception that I feel she has moderate left-sided foraminal stenosis at C5-6.Marland Kitchen  Assessment and Plan: Ms. Durney is a pleasant 43 y.o. female with left C6 radiculopathy that has persisted despite maximal conservative management.  She has moderate foraminal stenosis at C5-6 on the left.  She has exhausted conservative measures.  She is no longer a candidate for further conservative management.  I recommended C5-6 arthroplasty.  Showed her disc height is 3.7 mm.  She has no evidence of anterolisthesis or retrolisthesis.  She has a less than 11 degree segmental angulation at C5-6.  As such, she is an excellent candidate for cervical disc arthroplasty.  I discussed the planned procedure at length with the patient, including the risks, benefits, alternatives, and indications. The risks discussed include but are not limited to bleeding, infection, need for reoperation, spinal fluid leak, stroke, vision loss, anesthetic complication, coma, paralysis, and even death. We also  discussed the possibility of post-operative dysphagia, vocal cord paralysis, and the risk of adjacent segment disease in the future. I also described in detail that improvement was not guaranteed.  The patient expressed understanding of these risks, and asked that we proceed with surgery. I described the surgery in layman's terms, and gave ample opportunity for questions, which were answered to the best of my ability.   I spent a total of 30 minutes in this patient's care today. This time was spent reviewing pertinent records including imaging studies, obtaining and confirming history, performing a directed evaluation, formulating and discussing my recommendations, and documenting the visit within the medical record.      Thank you for involving me in the care of this patient.      Ulani Degrasse K. Myer Haff MD, [facility] Neurosurgery

## 2024-03-15 ENCOUNTER — Encounter: Payer: Self-pay | Admitting: Neurosurgery

## 2024-03-15 ENCOUNTER — Ambulatory Visit (INDEPENDENT_AMBULATORY_CARE_PROVIDER_SITE_OTHER): Admitting: Neurosurgery

## 2024-03-15 ENCOUNTER — Other Ambulatory Visit: Payer: Self-pay

## 2024-03-15 VITALS — BP 148/96 | Ht 65.0 in | Wt 148.0 lb

## 2024-03-15 DIAGNOSIS — M5412 Radiculopathy, cervical region: Secondary | ICD-10-CM

## 2024-03-15 DIAGNOSIS — M4802 Spinal stenosis, cervical region: Secondary | ICD-10-CM

## 2024-03-15 DIAGNOSIS — Z01818 Encounter for other preprocedural examination: Secondary | ICD-10-CM

## 2024-03-15 NOTE — Patient Instructions (Signed)
 Please see below for information in regards to your upcoming surgery:   Planned surgery: C5-6 arthroplasty   Surgery date: 03/28/24 at [facility]: [address], [place], [place] [address]) - you will find out your arrival time the business day before your surgery.   Pre-op appointment at [facility] Pre-admit Testing: you will receive a call with a date/time for this appointment. If you are scheduled for an in person appointment, Pre-admit Testing is located on the first floor of the [facility] building, [address], [address]. During this appointment, they will advise you which medications you can take the morning of surgery, and which medications you will need to hold for surgery. Labs (such as blood work, EKG) may be done at your pre-op appointment. You are not required to fast for these labs. Should you need to change your pre-op appointment, please call Pre-admit testing at [phone].      Common restrictions after surgery: No bending, lifting, or twisting ("BLT"). Avoid lifting objects heavier than 10 pounds for the first 6 weeks after surgery. Where possible, avoid household activities that involve lifting, bending, reaching, pushing, or pulling such as laundry, vacuuming, grocery shopping, and childcare. Try to arrange for help from friends and family for these activities while you heal. Do not drive while taking prescription pain medication. Weeks 6 through 12 after surgery: avoid lifting more than 25 pounds.    X-rays after surgery: Because you are having an arthroplasty: for appointments after your 2 week follow-up: please arrive at the [facility] outpatient imaging center ([address], [address], [place]) or [facility] one hour prior to your appointment for x-rays. This applies to every appointment after your 2 week follow-up. Failure to do so may result in your appointment being  rescheduled.   How to contact us:  If you have any questions/concerns before or after surgery, you can reach [place] at [phone], or you can send a mychart message. We can be reached by phone or mychart 8am-4pm, Monday-Friday.  *Please note: Calls after 4pm are forwarded to a third party answering service. Mychart messages are not routinely monitored during evenings, weekends, and holidays. Please call our office to contact the answering service for urgent concerns during non-business hours.   If you have FMLA/disability paperwork, please drop it off or fax it to [phone], attention Patty.   Appointments/FMLA & disability paperwork: Joycelyn Rua, & Flonnie Hailstone Registered Nurses/Surgery schedulers: Ilsa Iha Medical Assistants: Nash Mantis Physician Assistants: Joan Flores, PA-C, Manning Charity, PA-C & Drake Leach, PA-C Surgeons: Venetia Night, MD & Ernestine Mcmurray, MD   [facility] PREADMIT TESTING VISIT and SURGERY INFORMATION SHEET   Now that surgery has been scheduled you can anticipate several phone calls from [facility] services. A pharmacy technician will call you to verify your current list of medications taken at home.               The Pre-Service Center will call to verify your insurance information and to give you billing estimates and information.             The Preadmit Testing Office will be calling to schedule a visit to obtain information for the anesthesia team and provide instructions on preparation for surgery.  What can you expect for the Preadmit Testing Visit: Appointments may be scheduled in-person or by telephone.  If a telephone visit is scheduled, you may be asked to come into  the office to have lab tests or other studies performed.   This visit will not be completed any greater than 14 days prior to your surgery.  If your surgery has been scheduled for a future date, please do not be alarmed if we have not contacted you to  schedule an appointment more than a month prior to the surgery date.    Please be prepared to provide the following information during this appointment:            -Personal medical history                                               -Medication and allergy list            -Any history of problems with anesthesia              -Recent lab work or diagnostic studies            -Please notify us of any needs we should be aware of to provide the best care possible           -You will be provided with instructions on how to prepare for your surgery.    On The Day of Surgery:  You must have a driver to take you home after surgery, you will be asked not to drive for 24 hours following surgery.  Taxi, Benedetto Goad and non-medical transport will not be acceptable means of transportation unless you have a responsible individual who will be traveling with you.  Visitors in the surgical area:   2 people will be able to visit you in your room once your preparation for surgery has been completed. During surgery, your visitors will be asked to wait in the Surgery Waiting Area.  It is not a requirement for them to stay, if they prefer to leave and come back.  Your visitor(s) will be given an update once the surgery has been completed.  No visitors are allowed in the initial recovery room to respect patient privacy and safety.  Once you are more awake and transfer to the secondary recovery area, or are transferred to an inpatient room, visitors will again be able to see you.  To respect and protect your privacy: We will ask on the day of surgery who your driver will be and what the contact number for that individual will be. We will ask if it is okay to share information with this individual, or if there is an alternative individual that we, or the surgeon, should contact to provide updates and information. If family or friends come to the surgical information desk requesting information about you, who you have not  listed with [place], no information will be given.   It may be helpful to designate someone as the main contact who will be responsible for updating your other friends and family.    PREADMIT TESTING OFFICE: [phone] SAME DAY SURGERY: [phone] We look forward to caring for you before and throughout the process of your surgery.

## 2024-03-15 NOTE — Addendum Note (Signed)
 Addended by: Sharlot Gowda on: 03/15/2024 03:15 PM   Modules accepted: Orders

## 2024-03-21 ENCOUNTER — Other Ambulatory Visit: Payer: Self-pay

## 2024-03-21 ENCOUNTER — Encounter
Admission: RE | Admit: 2024-03-21 | Discharge: 2024-03-21 | Disposition: A | Discharge status: Home / Self Care | Source: Ambulatory Visit | Attending: Neurosurgery | Admitting: Neurosurgery

## 2024-03-21 DIAGNOSIS — D649 Anemia, unspecified: Secondary | ICD-10-CM | POA: Diagnosis not present

## 2024-03-21 DIAGNOSIS — Z01812 Encounter for preprocedural laboratory examination: Secondary | ICD-10-CM | POA: Insufficient documentation

## 2024-03-21 DIAGNOSIS — E0590 Thyrotoxicosis, unspecified without thyrotoxic crisis or storm: Secondary | ICD-10-CM | POA: Insufficient documentation

## 2024-03-21 HISTORY — DX: Spinal stenosis, cervical region: M48.02

## 2024-03-21 HISTORY — DX: Anemia, unspecified: D64.9

## 2024-03-21 HISTORY — DX: Prediabetes: R73.03

## 2024-03-21 HISTORY — DX: Radiculopathy, cervical region: M54.12

## 2024-03-21 HISTORY — DX: Other complications of anesthesia, initial encounter: T88.59XA

## 2024-03-21 LAB — CBC
HCT: 42.9 % (ref 36.0–46.0)
Hemoglobin: 14.8 g/dL (ref 12.0–15.0)
MCH: 30.5 pg (ref 26.0–34.0)
MCHC: 34.5 g/dL (ref 30.0–36.0)
MCV: 88.5 fL (ref 80.0–100.0)
Platelets: 201 10*3/uL (ref 150–400)
RBC: 4.85 MIL/uL (ref 3.87–5.11)
RDW: 12.4 % (ref 11.5–15.5)
WBC: 7.1 10*3/uL (ref 4.0–10.5)
nRBC: 0 % (ref 0.0–0.2)

## 2024-03-21 LAB — TYPE AND SCREEN
ABO/RH(D): A POS
Antibody Screen: NEGATIVE

## 2024-03-21 LAB — BASIC METABOLIC PANEL WITH GFR
Anion gap: 6 (ref 5–15)
BUN: 15 mg/dL (ref 6–20)
CO2: 25 mmol/L (ref 22–32)
Calcium: 9.4 mg/dL (ref 8.9–10.3)
Chloride: 109 mmol/L (ref 98–111)
Creatinine, Ser: 0.8 mg/dL (ref 0.44–1.00)
GFR, Estimated: >60 mL/min (ref >60)
Glucose, Bld: 88 mg/dL (ref 70–99)
Potassium: 4.4 mmol/L (ref 3.5–5.1)
Sodium: 140 mmol/L (ref 135–145)

## 2024-03-21 LAB — SURGICAL PCR SCREEN
MRSA, PCR: NEGATIVE
Staphylococcus aureus: NEGATIVE

## 2024-03-21 NOTE — Patient Instructions (Addendum)
 Your procedure is scheduled on:03-28-24 Monday Report to the Registration Desk on the 1st floor of the [facility].Then proceed to the 2nd floor Surgery Desk To find out your arrival time, please call [phone] between 1PM - 3PM on:03-25-24 Friday If your arrival time is 6:00 am, do not arrive before that time as the [facility] entrance doors do not open until 6:00 am.  REMEMBER: Instructions that are not followed completely may result in serious medical risk, up to and including death; or upon the discretion of your surgeon and anesthesiologist your surgery may need to be rescheduled.  Do not eat food after midnight the night before surgery.  No gum chewing or hard candies.  You may however, drink CLEAR liquids up to 2 hours before you are scheduled to arrive for your surgery. Do not drink anything within 2 hours of your scheduled arrival time.  Clear liquids include: - water  - apple juice without pulp - gatorade (not RED colors) - black coffee or tea (Do NOT add milk or creamers to the coffee or tea) Do NOT drink anything that is not on this list.  One week prior to surgery:Stop NOW (03-21-24) Stop ANY OVER THE COUNTER supplements until after surgery (Calm Magnesium, Multivitamin)  Continue taking all of your other prescription medications up until the day of surgery.  ON THE DAY OF SURGERY ONLY TAKE THESE MEDICATIONS WITH SIPS OF WATER: -methimazole (TAPAZOLE)   No Alcohol for 24 hours before or after surgery.  No Smoking including e-cigarettes for 24 hours before surgery.  No chewable tobacco products for at least 6 hours before surgery.  No nicotine patches on the day of surgery.  Do not use any "recreational" drugs for at least a week (preferably 2 weeks) before your surgery.  Please be advised that the combination of cocaine and anesthesia may have negative outcomes, up to and including death. If you test positive for cocaine, your surgery will be cancelled.  On the  morning of surgery brush your teeth with toothpaste and water, you may rinse your mouth with mouthwash if you wish. Do not swallow any toothpaste or mouthwash.  Use CHG Soap as directed on instruction sheet.  Do not wear jewelry, make-up, hairpins, clips or nail polish.  For welded (permanent) jewelry: bracelets, anklets, waist bands, etc.  Please have this removed prior to surgery.  If it is not removed, there is a chance that hospital personnel will need to cut it off on the day of surgery.  Do not wear lotions, powders, or perfumes.   Do not shave body hair from the neck down 48 hours before surgery.  Contact lenses, hearing aids and dentures may not be worn into surgery.  Do not bring valuables to the hospital. [facility] is not responsible for any missing/lost belongings or valuables.   Notify your doctor if there is any change in your medical condition (cold, fever, infection).  Wear comfortable clothing (specific to your surgery type) to the hospital.  After surgery, you can help prevent lung complications by doing breathing exercises.  Take deep breaths and cough every 1-2 hours. Your doctor may order a device called an Incentive Spirometer to help you take deep breaths. When coughing or sneezing, hold a pillow firmly against your incision with both hands. This is called "splinting." Doing this helps protect your incision. It also decreases belly discomfort.  If you are being admitted to the hospital overnight, leave your suitcase in the car. After surgery it  may be brought to your room.  In case of increased patient census, it may be necessary for you, the patient, to continue your postoperative care in the Same Day Surgery department.  If you are being discharged the day of surgery, you will not be allowed to drive home. You will need a responsible individual to drive you home and stay with you for 24 hours after surgery.   If you are taking public transportation, you will  need to have a responsible individual with you.  Please call the Pre-admissions Testing Dept. at [phone] if you have any questions about these instructions.  Surgery Visitation Policy:  Patients having surgery or a procedure may have two visitors.  Children under the age of 48 must have an adult with them who is not the patient.    Pre-operative 5 CHG Bath Instructions   You can play a key role in reducing the risk of infection after surgery. Your skin needs to be as free of germs as possible. You can reduce the number of germs on your skin by washing with CHG (chlorhexidine gluconate) soap before surgery. CHG is an antiseptic soap that kills germs and continues to kill germs even after washing.   DO NOT use if you have an allergy to chlorhexidine/CHG or antibacterial soaps. If your skin becomes reddened or irritated, stop using the CHG and notify one of our RNs at [phone].   Please shower with the CHG soap starting 4 days before surgery using the following schedule:     Please keep in mind the following:  DO NOT shave, including legs and underarms, starting the day of your first shower.   You may shave your face at any point before/day of surgery.  Place clean sheets on your bed the day you start using CHG soap. Use a clean washcloth (not used since being washed) for each shower. DO NOT sleep with pets once you start using the CHG.   CHG Shower Instructions:  If you choose to wash your hair and private area, wash first with your normal shampoo/soap.  After you use shampoo/soap, rinse your hair and body thoroughly to remove shampoo/soap residue.  Turn the water OFF and apply about 3 tablespoons (45 ml) of CHG soap to a CLEAN washcloth.  Apply CHG soap ONLY FROM YOUR NECK DOWN TO YOUR TOES (washing for 3-5 minutes)  DO NOT use CHG soap on face, private areas, open wounds, or sores.  Pay special attention to the area where your surgery is being performed.  If you are  having back surgery, having someone wash your back for you may be helpful. Wait 2 minutes after CHG soap is applied, then you may rinse off the CHG soap.  Pat dry with a clean towel  Put on clean clothes/pajamas   If you choose to wear lotion, please use ONLY the CHG-compatible lotions on the back of this paper.     Additional instructions for the day of surgery: DO NOT APPLY any lotions, deodorants, cologne, or perfumes.   Put on clean/comfortable clothes.  Brush your teeth.  Ask your nurse before applying any prescription medications to the skin.      CHG Compatible Lotions   Aveeno Moisturizing lotion  Cetaphil Moisturizing Cream  Cetaphil Moisturizing Lotion  Clairol Herbal Essence Moisturizing Lotion, Dry Skin  Clairol Herbal Essence Moisturizing Lotion, Extra Dry Skin  Clairol Herbal Essence Moisturizing Lotion, Normal Skin  Curel Age Defying Therapeutic Moisturizing Lotion with Alpha Hydroxy  Curel  Extreme Care Body Lotion  Curel Soothing Hands Moisturizing Hand Lotion  Curel Therapeutic Moisturizing Cream, Fragrance-Free  Curel Therapeutic Moisturizing Lotion, Fragrance-Free  Curel Therapeutic Moisturizing Lotion, Original Formula  Eucerin Daily Replenishing Lotion  Eucerin Dry Skin Therapy Plus Alpha Hydroxy Crme  Eucerin Dry Skin Therapy Plus Alpha Hydroxy Lotion  Eucerin Original Crme  Eucerin Original Lotion  Eucerin Plus Crme Eucerin Plus Lotion  Eucerin TriLipid Replenishing Lotion  Keri Anti-Bacterial Hand Lotion  Keri Deep Conditioning Original Lotion Dry Skin Formula Softly Scented  Keri Deep Conditioning Original Lotion, Fragrance Free Sensitive Skin Formula  Keri Lotion Fast Absorbing Fragrance Free Sensitive Skin Formula  Keri Lotion Fast Absorbing Softly Scented Dry Skin Formula  Keri Original Lotion  Keri Skin Renewal Lotion Keri Silky Smooth Lotion  Keri Silky Smooth Sensitive Skin Lotion  Nivea Body Creamy Conditioning Oil  Nivea Body Extra  Enriched Teacher, adult education Moisturizing Lotion Nivea Crme  Nivea Skin Firming Lotion  NutraDerm 30 Skin Lotion  NutraDerm Skin Lotion  NutraDerm Therapeutic Skin Cream  NutraDerm Therapeutic Skin Lotion  ProShield Protective Hand Cream  Provon moisturizing lotion

## 2024-03-28 ENCOUNTER — Ambulatory Visit

## 2024-03-28 ENCOUNTER — Other Ambulatory Visit: Payer: Self-pay

## 2024-03-28 ENCOUNTER — Encounter
Admission: RE | Disposition: A | Discharge status: Home / Self Care | Payer: Self-pay | Source: Home / Self Care | Attending: Neurosurgery

## 2024-03-28 ENCOUNTER — Ambulatory Visit: Payer: Self-pay | Admitting: Urgent Care

## 2024-03-28 ENCOUNTER — Ambulatory Visit
Admission: RE | Admit: 2024-03-28 | Discharge: 2024-03-28 | Disposition: A | Discharge status: Home / Self Care | Attending: Neurosurgery | Admitting: Neurosurgery

## 2024-03-28 ENCOUNTER — Encounter: Payer: Self-pay | Admitting: Neurosurgery

## 2024-03-28 ENCOUNTER — Other Ambulatory Visit: Payer: Self-pay | Admitting: Neurosurgery

## 2024-03-28 DIAGNOSIS — Z01812 Encounter for preprocedural laboratory examination: Secondary | ICD-10-CM

## 2024-03-28 DIAGNOSIS — M4802 Spinal stenosis, cervical region: Secondary | ICD-10-CM | POA: Diagnosis present

## 2024-03-28 DIAGNOSIS — R7303 Prediabetes: Secondary | ICD-10-CM | POA: Diagnosis not present

## 2024-03-28 DIAGNOSIS — E0590 Thyrotoxicosis, unspecified without thyrotoxic crisis or storm: Secondary | ICD-10-CM

## 2024-03-28 DIAGNOSIS — M5412 Radiculopathy, cervical region: Secondary | ICD-10-CM

## 2024-03-28 DIAGNOSIS — Z01818 Encounter for other preprocedural examination: Secondary | ICD-10-CM

## 2024-03-28 DIAGNOSIS — D649 Anemia, unspecified: Secondary | ICD-10-CM

## 2024-03-28 HISTORY — PX: CERVICAL DISC ARTHROPLASTY: SHX587

## 2024-03-28 SURGERY — CERVICAL ANTERIOR DISC ARTHROPLASTY
Anesthesia: General

## 2024-03-28 MED ORDER — FENTANYL CITRATE (PF) 100 MCG/2ML IJ SOLN
INTRAMUSCULAR | Status: AC
  Filled 2024-03-28: qty 2

## 2024-03-28 MED ORDER — DROPERIDOL 2.5 MG/ML IJ SOLN
INTRAMUSCULAR | Status: AC
  Filled 2024-03-28: qty 2

## 2024-03-28 MED ORDER — ONDANSETRON HCL 4 MG/2ML IJ SOLN
4.0000 mg | Freq: Once | INTRAMUSCULAR | Status: AC
  Administered 2024-03-28: 4 mg via INTRAVENOUS

## 2024-03-28 MED ORDER — ONDANSETRON HCL 4 MG/2ML IJ SOLN
INTRAMUSCULAR | Status: AC
  Filled 2024-03-28: qty 2

## 2024-03-28 MED ORDER — OXYCODONE HCL 5 MG PO TABS
ORAL_TABLET | ORAL | Status: AC
Start: 2024-03-28 — End: ?
  Filled 2024-03-28: qty 1

## 2024-03-28 MED ORDER — OXYCODONE HCL 5 MG/5ML PO SOLN: 5.0000 mg | Freq: Once | ORAL | Status: AC | PRN

## 2024-03-28 MED ORDER — LIDOCAINE HCL (PF) 2 % IJ SOLN
INTRAMUSCULAR | Status: AC
  Filled 2024-03-28: qty 5

## 2024-03-28 MED ORDER — MIDAZOLAM HCL 5 MG/5ML IJ SOLN
INTRAMUSCULAR | Status: DC | PRN
  Administered 2024-03-28: 2 mg via INTRAVENOUS

## 2024-03-28 MED ORDER — BUPIVACAINE-EPINEPHRINE (PF) 0.5% -1:200000 IJ SOLN
INTRAMUSCULAR | Status: AC
  Filled 2024-03-28: qty 10

## 2024-03-28 MED ORDER — FENTANYL CITRATE (PF) 100 MCG/2ML IJ SOLN
25.0000 ug | INTRAMUSCULAR | Status: DC | PRN
  Administered 2024-03-28 (×3): 50 ug via INTRAVENOUS

## 2024-03-28 MED ORDER — 0.9 % SODIUM CHLORIDE (POUR BTL) OPTIME
TOPICAL | Status: DC | PRN
  Administered 2024-03-28: 350 mL

## 2024-03-28 MED ORDER — SUCCINYLCHOLINE CHLORIDE 200 MG/10ML IV SOSY
PREFILLED_SYRINGE | INTRAVENOUS | Status: AC
  Filled 2024-03-28: qty 10

## 2024-03-28 MED ORDER — REMIFENTANIL HCL 1 MG IV SOLR
INTRAVENOUS | Status: DC | PRN
  Administered 2024-03-28: .2 ug/kg/min via INTRAVENOUS

## 2024-03-28 MED ORDER — METHOCARBAMOL 500 MG PO TABS
500.0000 mg | ORAL_TABLET | Freq: Once | ORAL | Status: AC
  Administered 2024-03-28: 500 mg via ORAL

## 2024-03-28 MED ORDER — ORAL CARE MOUTH RINSE: 15.0000 mL | Freq: Once | OROMUCOSAL | Status: AC

## 2024-03-28 MED ORDER — KETOROLAC TROMETHAMINE 30 MG/ML IJ SOLN
INTRAMUSCULAR | Status: AC
  Filled 2024-03-28: qty 1

## 2024-03-28 MED ORDER — PROPOFOL 10 MG/ML IV BOLUS
INTRAVENOUS | Status: DC | PRN
  Administered 2024-03-28: 160 mg via INTRAVENOUS
  Administered 2024-03-28: 40 mg via INTRAVENOUS

## 2024-03-28 MED ORDER — KETOROLAC TROMETHAMINE 30 MG/ML IJ SOLN
INTRAMUSCULAR | Status: DC | PRN
  Administered 2024-03-28: 30 mg via INTRAVENOUS

## 2024-03-28 MED ORDER — EPHEDRINE SULFATE-NACL 50-0.9 MG/10ML-% IV SOSY
PREFILLED_SYRINGE | INTRAVENOUS | Status: DC | PRN
  Administered 2024-03-28: 5 mg via INTRAVENOUS

## 2024-03-28 MED ORDER — PROPOFOL 1000 MG/100ML IV EMUL
INTRAVENOUS | Status: AC
  Filled 2024-03-28: qty 100

## 2024-03-28 MED ORDER — DROPERIDOL 2.5 MG/ML IJ SOLN
0.6250 mg | Freq: Once | INTRAMUSCULAR | Status: AC
  Administered 2024-03-28: 0.625 mg via INTRAVENOUS

## 2024-03-28 MED ORDER — CHLORHEXIDINE GLUCONATE 0.12 % MT SOLN
15.0000 mL | Freq: Once | OROMUCOSAL | Status: AC
  Administered 2024-03-28: 15 mL via OROMUCOSAL

## 2024-03-28 MED ORDER — LACTATED RINGERS IV SOLN: INTRAVENOUS | Status: DC

## 2024-03-28 MED ORDER — PROPOFOL 500 MG/50ML IV EMUL
INTRAVENOUS | Status: DC | PRN
  Administered 2024-03-28: 125 ug/kg/min via INTRAVENOUS

## 2024-03-28 MED ORDER — DEXAMETHASONE SODIUM PHOSPHATE 10 MG/ML IJ SOLN
INTRAMUSCULAR | Status: DC | PRN
Start: 2024-03-28 — End: 2024-03-28
  Administered 2024-03-28: 10 mg via INTRAVENOUS

## 2024-03-28 MED ORDER — SURGIFLO WITH THROMBIN (HEMOSTATIC MATRIX KIT) OPTIME
TOPICAL | Status: DC | PRN
  Administered 2024-03-28: 1 via TOPICAL

## 2024-03-28 MED ORDER — OXYCODONE HCL 5 MG PO TABS
5.0000 mg | ORAL_TABLET | ORAL | 0 refills | Status: DC | PRN
  Filled 2024-03-28: qty 30, 5d supply, fill #0

## 2024-03-28 MED ORDER — DEXMEDETOMIDINE HCL IN NACL 80 MCG/20ML IV SOLN
INTRAVENOUS | Status: DC | PRN
  Administered 2024-03-28: 8 ug via INTRAVENOUS
  Administered 2024-03-28: 4 ug via INTRAVENOUS
  Administered 2024-03-28: 8 ug via INTRAVENOUS

## 2024-03-28 MED ORDER — ACETAMINOPHEN 10 MG/ML IV SOLN
INTRAVENOUS | Status: AC
  Filled 2024-03-28: qty 100

## 2024-03-28 MED ORDER — BUPIVACAINE-EPINEPHRINE (PF) 0.5% -1:200000 IJ SOLN
INTRAMUSCULAR | Status: DC | PRN
  Administered 2024-03-28: 6 mL via PERINEURAL

## 2024-03-28 MED ORDER — CHLORHEXIDINE GLUCONATE 0.12 % MT SOLN
OROMUCOSAL | Status: AC
  Filled 2024-03-28: qty 15

## 2024-03-28 MED ORDER — SENNA 8.6 MG PO TABS
1.0000 | ORAL_TABLET | Freq: Every day | ORAL | 0 refills | Status: DC | PRN
  Filled 2024-03-28: qty 30, 30d supply, fill #0

## 2024-03-28 MED ORDER — LIDOCAINE HCL (CARDIAC) PF 100 MG/5ML IV SOSY
PREFILLED_SYRINGE | INTRAVENOUS | Status: DC | PRN
  Administered 2024-03-28: 100 mg via INTRAVENOUS

## 2024-03-28 MED ORDER — HYDROMORPHONE HCL 1 MG/ML IJ SOLN
INTRAMUSCULAR | Status: AC
  Filled 2024-03-28: qty 1

## 2024-03-28 MED ORDER — FENTANYL CITRATE (PF) 100 MCG/2ML IJ SOLN
INTRAMUSCULAR | Status: DC | PRN
Start: 2024-03-28 — End: 2024-03-28
  Administered 2024-03-28 (×2): 50 ug via INTRAVENOUS

## 2024-03-28 MED ORDER — NAPROXEN 500 MG PO TABS
500.0000 mg | ORAL_TABLET | Freq: Two times a day (BID) | ORAL | 0 refills | Status: DC
  Filled 2024-03-28: qty 60, 30d supply, fill #0

## 2024-03-28 MED ORDER — KETAMINE HCL 50 MG/5ML IJ SOSY
PREFILLED_SYRINGE | INTRAMUSCULAR | Status: AC
  Filled 2024-03-28: qty 5

## 2024-03-28 MED ORDER — ACETAMINOPHEN 10 MG/ML IV SOLN
INTRAVENOUS | Status: DC | PRN
  Administered 2024-03-28: 1000 mg via INTRAVENOUS

## 2024-03-28 MED ORDER — REMIFENTANIL HCL 1 MG IV SOLR
INTRAVENOUS | Status: AC
Start: 2024-03-28 — End: ?
  Filled 2024-03-28: qty 1000

## 2024-03-28 MED ORDER — KETAMINE HCL 10 MG/ML IJ SOLN
INTRAMUSCULAR | Status: DC | PRN
  Administered 2024-03-28: 20 mg via INTRAVENOUS

## 2024-03-28 MED ORDER — PHENYLEPHRINE 80 MCG/ML (10ML) SYRINGE FOR IV PUSH (FOR BLOOD PRESSURE SUPPORT)
PREFILLED_SYRINGE | INTRAVENOUS | Status: DC | PRN
  Administered 2024-03-28: 120 ug via INTRAVENOUS
  Administered 2024-03-28 (×2): 160 ug via INTRAVENOUS

## 2024-03-28 MED ORDER — OXYCODONE HCL 5 MG PO TABS
5.0000 mg | ORAL_TABLET | Freq: Once | ORAL | Status: AC | PRN
  Administered 2024-03-28: 5 mg via ORAL

## 2024-03-28 MED ORDER — DEXAMETHASONE SODIUM PHOSPHATE 10 MG/ML IJ SOLN
INTRAMUSCULAR | Status: AC
  Filled 2024-03-28: qty 1

## 2024-03-28 MED ORDER — HYDROMORPHONE HCL 1 MG/ML IJ SOLN
0.5000 mg | INTRAMUSCULAR | Status: DC | PRN
  Administered 2024-03-28: 0.5 mg via INTRAVENOUS

## 2024-03-28 MED ORDER — CEFAZOLIN SODIUM-DEXTROSE 2-4 GM/100ML-% IV SOLN
2.0000 g | Freq: Once | INTRAVENOUS | Status: AC
  Administered 2024-03-28: 2 g via INTRAVENOUS

## 2024-03-28 MED ORDER — MIDAZOLAM HCL 2 MG/2ML IJ SOLN
INTRAMUSCULAR | Status: AC
  Filled 2024-03-28: qty 2

## 2024-03-28 MED ORDER — METHOCARBAMOL 500 MG PO TABS
ORAL_TABLET | ORAL | Status: AC
  Filled 2024-03-28: qty 1

## 2024-03-28 MED ORDER — CEFAZOLIN SODIUM-DEXTROSE 2-4 GM/100ML-% IV SOLN
INTRAVENOUS | Status: AC
  Filled 2024-03-28: qty 100

## 2024-03-28 MED ORDER — METHOCARBAMOL 500 MG PO TABS
500.0000 mg | ORAL_TABLET | Freq: Four times a day (QID) | ORAL | 0 refills | Status: DC | PRN
  Filled 2024-03-28: qty 120, 30d supply, fill #0

## 2024-03-28 MED ORDER — SUCCINYLCHOLINE CHLORIDE 200 MG/10ML IV SOSY
PREFILLED_SYRINGE | INTRAVENOUS | Status: DC | PRN
  Administered 2024-03-28: 100 mg via INTRAVENOUS

## 2024-03-28 SURGICAL SUPPLY — 30 items
BUR NEURO DRILL SOFT 3.0X3.8M (BURR) ×1 IMPLANT
DERMABOND ADVANCED .7 DNX12 (GAUZE/BANDAGES/DRESSINGS) ×1 IMPLANT
DISC MOBI-C CERVICAL 13X15 4.5 (Miscellaneous) IMPLANT
DRAPE C ARM PK CFD 31 SPINE (DRAPES) ×1 IMPLANT
DRAPE LAPAROTOMY 77X122 PED (DRAPES) ×1 IMPLANT
DRAPE MICROSCOPE SPINE 48X150 (DRAPES) ×1 IMPLANT
ELECTRODE REM PT RTRN 9FT ADLT (ELECTROSURGICAL) ×1 IMPLANT
GLOVE BIOGEL PI IND STRL 6.5 (GLOVE) ×1 IMPLANT
GLOVE SURG SYN 6.5 ES PF (GLOVE) ×1 IMPLANT
GLOVE SURG SYN 6.5 PF PI (GLOVE) ×1 IMPLANT
GLOVE SURG SYN 8.5 E (GLOVE) ×3 IMPLANT
GLOVE SURG SYN 8.5 PF PI (GLOVE) ×3 IMPLANT
GOWN SRG LRG LVL 4 IMPRV REINF (GOWNS) ×1 IMPLANT
GOWN SRG XL LVL 3 NONREINFORCE (GOWNS) ×1 IMPLANT
KIT TURNOVER KIT A (KITS) ×1 IMPLANT
MANIFOLD NEPTUNE II (INSTRUMENTS) ×1 IMPLANT
MARKER SKIN DUAL TIP RULER LAB (MISCELLANEOUS) ×2 IMPLANT
NS IRRIG 500ML POUR BTL (IV SOLUTION) ×1 IMPLANT
PACK LAMINECTOMY ARMC (PACKS) ×1 IMPLANT
PAD ARMBOARD POSITIONER FOAM (MISCELLANEOUS) ×2 IMPLANT
PIN CASPAR SPINAL 12MM (PIN) IMPLANT
SPONGE KITTNER 5P (MISCELLANEOUS) ×1 IMPLANT
STAPLER SKIN PROX 35W (STAPLE) IMPLANT
SURGIFLO W/THROMBIN 8M KIT (HEMOSTASIS) ×1 IMPLANT
SUT STRATA 3-0 15 PS-2 (SUTURE) ×1 IMPLANT
SUT VICRYL 3-0 CR8 SH (SUTURE) ×1 IMPLANT
SYR 20ML LL LF (SYRINGE) ×1 IMPLANT
TAPE CLOTH 3X10 WHT NS LF (GAUZE/BANDAGES/DRESSINGS) ×2 IMPLANT
TRAP FLUID SMOKE EVACUATOR (MISCELLANEOUS) ×1 IMPLANT
TRAY FOLEY SLVR 16FR LF STAT (SET/KITS/TRAYS/PACK) IMPLANT

## 2024-03-28 NOTE — Discharge Instructions (Addendum)
 Your surgeon has performed an operation on your cervical spine (neck) to relieve pressure on the spinal cord and/or nerves. This involved making an incision in the front of your neck and removing one or more of the discs that support your spine. Next, a small piece of bone, a titanium plate, and screws were used to fuse two or more of the vertebrae (bones) together.  The following are instructions to help in your recovery once you have been discharged from the hospital. Even if you feel well, it is important that you follow these activity guidelines. If you do not let your neck heal properly from the surgery, you can increase the chance of return of your symptoms and other complications.  Activity    No bending, lifting, or twisting ("BLT"). Avoid lifting objects heavier than 10 pounds (gallon milk jug).  Where possible, avoid household activities that involve lifting, bending, reaching, pushing, or pulling such as laundry, vacuuming, grocery shopping, and childcare. Try to arrange for help from friends and family for these activities while your back heals.  Increase physical activity slowly as tolerated.  Taking short walks is encouraged, but avoid strenuous exercise. Do not jog, run, bicycle, lift weights, or participate in any other exercises unless specifically allowed by your doctor.  Talk to your doctor before resuming sexual activity.  You should not drive until cleared by your doctor.  Until released by your doctor, you should not return to work or school.  You should rest at home and let your body heal.   You may shower three days after your surgery.  After showering, lightly dab your incision dry. Do not take a tub bath or go swimming until approved by your doctor at your follow-up appointment.  If your doctor ordered a cervical collar (neck brace) for you, you should wear it whenever you are out of bed. You may remove it when lying down or sleeping, but you should wear it at all other  times. Not all neck surgeries require a cervical collar.  If you smoke, we strongly recommend that you quit.  Smoking has been proven to interfere with normal bone healing and will dramatically reduce the success rate of your surgery. Please contact [facility] (800-QUIT-NOW) and use the resources at www.[facility].com for assistance in stopping smoking.  Surgical Incision   If you have a dressing on your incision, you may remove it two days after your surgery. Keep your incision area clean and dry.  If you have staples or stitches on your incision, you should have a follow up scheduled for removal. If you do not have staples or stitches, you will have steri-strips (small pieces of surgical tape) or Dermabond glue. The steri-strips/glue should begin to peel away within about a week (it is fine if the steri-strips fall off before then). If the strips are still in place one week after your surgery, you may gently remove them.  Diet           You may return to your usual diet. However, you may experience discomfort when swallowing in the first month after your surgery. This is normal. You may find that softer foods are more comfortable for you to swallow. Be sure to stay hydrated.  When to Contact us  You may experience pain in your neck and/or pain between your shoulder blades. This is normal and should improve in the next few weeks with the help of pain medication, muscle relaxers, and rest. Some patients report that a warm compress  on the back of the neck or between the shoulder blades helps.  However, should you experience any of the following, contact us immediately: New numbness or weakness Pain that is progressively getting worse, and is not relieved by your pain medication, muscle relaxers, rest, and warm compresses Bleeding, redness, swelling, pain, or drainage from surgical incision Chills or flu-like symptoms Fever greater than 101.0 F (38.3 C) Inability to eat, drink fluids, or take  medications Problems with bowel or bladder functions Difficulty breathing or shortness of breath Warmth, tenderness, or swelling in your calf Contact Information How to contact us:  If you have any questions/concerns before or after surgery, you can reach [place] at [phone], or you can send a mychart message. We can be reached by phone or mychart 8am-4pm, Monday-Friday.  *Please note: Calls after 4pm are forwarded to a third party answering service. Mychart messages are not routinely monitored during evenings, weekends, and holidays. Please call our office to contact the answering service for urgent concerns during non-business hours.

## 2024-03-28 NOTE — TOC Transition Note (Signed)
 Transition of Care [facility]) - Discharge Note   Patient Details  Name: Donna Mejia MRN: [id number] Date of Birth: 17-Aug-1981  [facility]) CM/SW Contact:  Alexandra Ice, RN Phone Number: 03/28/2024, 11:53 AM   Clinical Narrative:      Brief assessment completed. No TOC needs identified, will continue monitor, TOC services are needed.      Patient Goals and CMS Choice        Expected Discharge Plan and Services           Expected Discharge Date: 03/28/24                                    Prior Living Arrangements/Services                       Activities of Daily Living      Permission Sought/Granted                  Emotional Assessment              Admission diagnosis:  Cervical radiculopathy [M54.12] Cervical stenosis of spinal canal [M48.02] Patient Active Problem List   Diagnosis Date Noted   Cervical radiculopathy 03/28/2024   Cervical spinal stenosis 03/28/2024   Anxiety 05/16/2016   PCP:  Pcp, No Pharmacy:   [facility] [address] [place] - [address] [place] [address] Phone: [phone] Fax: [phone]  [facility] [address] [place] [address] Phone: [phone] Fax: [phone]     Social Determinants of Health (SDOH) Interventions    Readmission Risk Interventions     No data to display                  Patient Goals and CMS Choice            Discharge Placement                       Discharge Plan and Services Additional resources added to the After Visit Summary for                                       Social Drivers of Health (SDOH) Interventions SDOH Screenings   Food Insecurity: Unknown (12/31/2023)   Received from [facility]  Housing: Unknown (03/06/2024)   Received from [facility]  Transportation Needs: No  Transportation Needs (12/31/2023)   Received from [facility]  Utilities: Not At Risk (12/31/2023)   Received from [facility]  Depression [phone]): Medium Risk (04/30/2020)  Financial Resource Strain: Low Risk  (12/31/2023)   Received from [facility]  Tobacco Use: Low Risk  (03/28/2024)     Readmission Risk Interventions     No data to display

## 2024-03-28 NOTE — Discharge Summary (Signed)
 Discharge Summary  Patient ID: Donna Mejia MRN: [id number] DOB/AGE: 1981-10-23 43 y.o.  Admit date: 03/28/2024 Discharge date: 03/28/2024  Admission Diagnoses: M54.12 Cervical radiculopathy, M48.02 Cervical stenosis of spinal canal  Discharge Diagnoses:  Active Problems:   * No active hospital problems. *   Discharged Condition: good  Hospital Course:  Archer Vise is a 43 y.o presenting with cervical radiculopathy s/p C5-6 arthroplasty. Her intraoperative course was uncomplicated. She was monitored in PACU and discharged home after ambulating, urinating, and tolerating PO intake. She was given prescriptions for Oxycodone , Robaxin , and Senna to take as needed   Consults: None  Significant Diagnostic Studies: NA  Treatments: surgery: as above. Please see separately dictated operative report for further details  Discharge Exam: Blood pressure (!) 146/93, temperature 100 F (37.8 C), resp. rate 17, height 5\' 6"  (1.676 m), weight 67 kg, last menstrual period 10/23/2020. CN grossly intact 5/5 strength throughout Incision c/d/I with dermabond in place  Disposition: Discharge disposition: 01-Home or Self Care        Allergies as of 03/28/2024       Reactions   Levaquin [levofloxacin] Rash   AROUND JAW AND THROAT   Penicillins Rash   DID THE REACTION INVOLVE: Swelling of the face/tongue/throat, SOB, or low BP? No Sudden or severe rash/hives, skin peeling, or the inside of the mouth or nose? Yes Did it require medical treatment? No When did it last happen?      10 years ago If all above answers are "NO", may proceed with cephalosporin use.        Medication List     STOP taking these medications    ibuprofen  200 MG tablet Commonly known as: ADVIL        TAKE these medications    cetirizine 10 MG tablet Commonly known as: ZYRTEC Take 10 mg by mouth daily as needed for allergies.   methimazole 5 MG tablet Commonly known as: TAPAZOLE Take 2.5 mg by  mouth in the morning.   methocarbamol  500 MG tablet Commonly known as: ROBAXIN  Take 1 tablet (500 mg total) by mouth every 6 (six) hours as needed for muscle spasms.   multivitamin with minerals Tabs tablet Take 1 tablet by mouth every other day.   naproxen  500 MG tablet Commonly known as: Naprosyn  Take 1 tablet (500 mg total) by mouth 2 (two) times daily with a meal.   OVER THE COUNTER MEDICATION Take 1 Scoop by mouth as needed (Calm Magnesium).   oxyCODONE  5 MG immediate release tablet Commonly known as: Roxicodone  Take 1 tablet (5 mg total) by mouth every 4 (four) hours as needed for severe pain (pain score 7-10).   senna 8.6 MG Tabs tablet Commonly known as: SENOKOT Take 1 tablet (8.6 mg total) by mouth daily as needed.        Follow-up Information     Noble Bateman, PA Follow up on 04/12/2024.   Specialty: Neurosurgery Contact information: [address] [place] [address] [phone]                 Signed: Noble Bateman 03/28/2024, 8:53 AM

## 2024-03-28 NOTE — Op Note (Signed)
 Indications: Donna Mejia is a 43 y.o. female with M54.12 Cervical radiculopathy, M48.02 Cervical stenosis of spinal canal   She failed conservative management prompting surgery.  Findings: stenosis  Preoperative Diagnosis: M54.12 Cervical radiculopathy, M48.02 Cervical stenosis of spinal canal  Postoperative Diagnosis: same   EBL: 20 ml IVF: see AR Drains: none Disposition: Extubated and Stable to PACU Complications: none  No foley catheter was placed.   Preoperative Note:   Risks of surgery discussed include: infection, bleeding, stroke, coma, death, paralysis, CSF leak, nerve/spinal cord injury, numbness, tingling, weakness, complex regional pain syndrome, recurrent stenosis and/or disc herniation, vascular injury, development of instability, neck/back pain, need for further surgery, persistent symptoms, development of deformity, and the risks of anesthesia. The patient understood these risks and agreed to proceed.  Operative Note:  Procedure:  1) Cervical Disc Arthroplasty at C5/6 using a LDR Mobi-C device   Procedure: After obtaining informed consent, the patient taken to the operating room, placed in supine position, general anesthesia induced.  The patient had a small shoulder roll placed behind the neck.  The patient received preop antibiotics and IV Decadron .  The patient had a neck incision outlined, was prepped and draped in usual sterile fashion. The incision was injected with local anesthetic.   An incision was opened, dissection taken down medial to the carotid artery and jugular vein, lateral to the trachea and esophagus.  The prevertebral fascia identified and a localizing x-ray demonstrated the correct level.  The longus colli were dissected laterally, and self-retaining retractors placed to open the operative field. The microscope was then brought into the field.  With this complete, distractor pins were placed in the vertebral bodies of C5 and C6. The distractor  was placed, and the annulus at C5/6 was opened using a bovie.  Curettes and pituitary rongeurs used to remove the majority of disk, then the drill was used to remove the posterior osteophyte and begin the foraminotomies. The nerve hook was used to elevate the posterior longitudinal ligament, which was then removed with Kerrison rongeurs. The Kerrison was then used to complete the foraminotomies bilaterally. The microblunt nerve hook could be passed out the foramen bilaterally.   Meticulous hemostasis was obtained.  A trial spacer was used to size the disc space. Using flouroscopic guidance, a 15 mm width x 13 mm depth x 4.5 mm height Mobi-C was then inserted in the prepared disc space.  The caspar distractor was removed, and bone wax used for hemostasis. Final AP and lateral radiographs were taken.   With the disc arthroplasty in good position, the wound was irrigated copiously and meticulous hemostasis obtained.  Wound was closed in 2 layers using interrupted inverted 3-0 Vicryl sutures.  The wound was dressed with dermabond, the head of bed at 30 degrees, taken to recovery room in stable condition.  No new postop neurological deficits were identified.  Sponge and pattie counts were correct at the end of the procedure.     I performed the entire procedure with the assistance of Anastacio Karvonen PA as an Designer, television/film set. An assistant was required for this procedure due to the complexity.  The assistant provided assistance in tissue manipulation and suction, and was required for the successful and safe performance of the procedure. I performed the critical portions of the procedure.   Jodeen Munch MD

## 2024-03-28 NOTE — Transfer of Care (Signed)
 Immediate Anesthesia Transfer of Care Note  Patient: Donna Mejia  Procedure(s) Performed: CERVICAL ANTERIOR DISC ARTHROPLASTY  Patient Location: PACU  Anesthesia Type:General  Level of Consciousness: awake and patient cooperative  Airway & Oxygen Therapy: Patient Spontanous Breathing and Patient connected to face mask oxygen  Post-op Assessment: Report given to RN and Post -op Vital signs reviewed and stable  Post vital signs: Reviewed and stable  Last Vitals:  Vitals Value Taken Time  BP 133/80 03/28/24 0854  Temp    Pulse 108 03/28/24 0857  Resp    SpO2 100 % 03/28/24 0857  Vitals shown include unfiled device data.  Last Pain:  Vitals:   03/28/24 0611  PainSc: 3          Complications: There were no known notable events for this encounter.

## 2024-03-28 NOTE — Anesthesia Procedure Notes (Signed)
 Procedure Name: Intubation Date/Time: 03/28/2024 7:22 AM  Performed by: Morey Ar, RNPre-anesthesia Checklist: Patient identified, Patient being monitored, Timeout performed, Emergency Drugs available and Suction available Patient Re-evaluated:Patient Re-evaluated prior to induction Oxygen Delivery Method: Circle system utilized Preoxygenation: Pre-oxygenation with 100% oxygen Induction Type: IV induction Ventilation: Mask ventilation without difficulty Laryngoscope Size: McGrath and 3 Grade View: Grade I Tube type: Oral Tube size: 7.0 mm Number of attempts: 1 Airway Equipment and Method: Stylet Placement Confirmation: ETT inserted through vocal cords under direct vision, positive ETCO2 and breath sounds checked- equal and bilateral Secured at: 22 cm Tube secured with: Tape Dental Injury: Teeth and Oropharynx as per pre-operative assessment  Comments: atraumatic

## 2024-03-28 NOTE — Anesthesia Preprocedure Evaluation (Signed)
 Anesthesia Evaluation  Patient identified by MRN, date of birth, ID band Patient awake    Reviewed: Allergy & Precautions, NPO status , Patient's Chart, lab work & pertinent test results  History of Anesthesia Complications (+) PONV and history of anesthetic complications  Airway Mallampati: I  TM Distance: >3 FB Neck ROM: full    Dental  (+) Chipped   Pulmonary neg pulmonary ROS   Pulmonary exam normal        Cardiovascular negative cardio ROS Normal cardiovascular exam     Neuro/Psych  PSYCHIATRIC DISORDERS Anxiety     negative neurological ROS  negative psych ROS   GI/Hepatic negative GI ROS, Neg liver ROS,,,  Endo/Other  negative endocrine ROSHypothyroidism    Renal/GU negative Renal ROS  negative genitourinary   Musculoskeletal   Abdominal   Peds  Hematology negative hematology ROS (+)   Anesthesia Other Findings Past Medical History: No date: Anemia No date: Anxiety No date: Complication of anesthesia     Comment:  delayed emergence after hysterectomy No date: Dysmenorrhea No date: Family history of adverse reaction to anesthesia     Comment:  MOM-N/V No date: Foraminal stenosis of cervical region No date: Hyperthyroidism No date: Hypothyroidism No date: IMB (intermenstrual bleeding) No date: PCOD (polycystic ovarian disease) No date: Pelvic pain in female No date: PONV (postoperative nausea and vomiting)     Comment:  nausea only after lumpectomy No date: Pre-diabetes No date: Radiculopathy of cervical spine  Past Surgical History: 11/19/2018: BREAST BIOPSY; Left     Comment:  Sclerosing lesion 12/03/2018: BREAST BIOPSY; Left     Comment:  Procedure: BREAST BIOPSY WITH NEEDLE LOCALIZATION;                Surgeon: Eldred Grego, MD;  Location: [facility];               Service: General;  Laterality: Left; 12/03/2018: BREAST EXCISIONAL BIOPSY; Left     Comment:  Sclerosing lesion No  date: BREAST LUMPECTOMY; Left     Comment:  sclerosing lesion No date: DILATION AND CURETTAGE OF UTERUS 08/2010: HYSTEROSCOPY     Comment:  secretory endometrium 11/05/2020: TOTAL LAPAROSCOPIC HYSTERECTOMY WITH SALPINGECTOMY;  Bilateral     Comment:  Procedure: TOTAL LAPAROSCOPIC HYSTERECTOMY WITH                BILATERAL  SALPINGECTOMY;  Surgeon: Ward, Margarie Shay, MD;               Location: [facility];  Service: Gynecology;  Laterality:               Bilateral;  BMI    Body Mass Index: 23.84 kg/m      Reproductive/Obstetrics negative OB ROS                             Anesthesia Physical Anesthesia Plan  ASA: 2  Anesthesia Plan: General ETT   Post-op Pain Management:    Induction: Intravenous  PONV Risk Score and Plan: 4 or greater and Ondansetron , Dexamethasone , Propofol  infusion, TIVA and Midazolam   Airway Management Planned: Oral ETT  Additional Equipment:   Intra-op Plan:   Post-operative Plan: Extubation in OR  Informed Consent: I have reviewed the patients History and Physical, chart, labs and discussed the procedure including the risks, benefits and alternatives for the proposed anesthesia with the patient or authorized representative who has indicated his/her understanding and acceptance.  Dental Advisory Given  Plan Discussed with: Anesthesiologist, CRNA and Surgeon  Anesthesia Plan Comments: (Patient consented for risks of anesthesia including but not limited to:  - adverse reactions to medications - damage to eyes, teeth, lips or other oral mucosa - nerve damage due to positioning  - sore throat or hoarseness - Damage to heart, brain, nerves, lungs, other parts of body or loss of life  Patient voiced understanding and assent.)       Anesthesia Quick Evaluation

## 2024-03-28 NOTE — Interval H&P Note (Signed)
 History and Physical Interval Note:  03/28/2024 7:04 AM  Donna Mejia  has presented today for surgery, with the diagnosis of M54.12 Cervical radiculopathy M48.02 Cervical stenosis of spinal canal.  The various methods of treatment have been discussed with the patient and family. After consideration of risks, benefits and other options for treatment, the patient has consented to  Procedure(s) with comments: CERVICAL ANTERIOR DISC ARTHROPLASTY (N/A) - C5-6 ARTHROPLASTY as a surgical intervention.  The patient's history has been reviewed, patient examined, no change in status, stable for surgery.  I have reviewed the patient's chart and labs.  Questions were answered to the patient's satisfaction.    Heart sounds normal no MRG. Chest Clear to Auscultation Bilaterally.  Brek Reece

## 2024-03-29 ENCOUNTER — Other Ambulatory Visit: Payer: Self-pay | Admitting: Neurosurgery

## 2024-03-29 ENCOUNTER — Telehealth: Payer: Self-pay | Admitting: Neurosurgery

## 2024-03-29 ENCOUNTER — Telehealth: Payer: Self-pay

## 2024-03-29 ENCOUNTER — Encounter: Payer: Self-pay | Admitting: Neurosurgery

## 2024-03-29 MED ORDER — TRAMADOL HCL 50 MG PO TABS: 50.0000 mg | ORAL_TABLET | ORAL | 0 refills | Status: AC | PRN

## 2024-03-29 MED ORDER — ONDANSETRON 4 MG PO TBDP: 4.0000 mg | ORAL_TABLET | Freq: Three times a day (TID) | ORAL | 0 refills | Status: DC | PRN

## 2024-03-29 NOTE — Progress Notes (Signed)
 Patient called last night experiencing nausea with Oxycodone . Requested Tramadol . Prescription sent for Tramadol  and Zofran  to take as needed

## 2024-03-29 NOTE — Telephone Encounter (Signed)
  Media Information   FYI

## 2024-03-29 NOTE — Telephone Encounter (Signed)
 Tramadol  has been approved and is valid from 03/29/24 to 04/28/24.   I have faxed this to the pharmacy.

## 2024-03-29 NOTE — Telephone Encounter (Signed)
 Donna Mejia has sent in Tramadol  and Zofran - patient notified.

## 2024-03-29 NOTE — Telephone Encounter (Signed)
 I called to check up on the patient this morning. Her pain is well controlled at 5-7 on the pain scale. She spoke to Dr. Mont Antis last night and he increased the dosage of her Robaxin , Tylenol  and Naproxen . She stated he could send in Tramadol . She wanted to wait on this just now but said she would contact us  if she felt her pain was not controlled. She is no longer having nausea and vomiting after discontinuing the oxycodone .

## 2024-03-30 NOTE — Anesthesia Postprocedure Evaluation (Signed)
 Anesthesia Post Note  Patient: Donna Mejia  Procedure(s) Performed: CERVICAL ANTERIOR DISC ARTHROPLASTY  Patient location during evaluation: PACU Anesthesia Type: General Level of consciousness: awake and alert Pain management: pain level controlled Vital Signs Assessment: post-procedure vital signs reviewed and stable Respiratory status: spontaneous breathing, nonlabored ventilation, respiratory function stable and patient connected to nasal cannula oxygen Cardiovascular status: blood pressure returned to baseline and stable Postop Assessment: no apparent nausea or vomiting Anesthetic complications: no   There were no known notable events for this encounter.   Last Vitals:  Vitals:   03/28/24 1038 03/28/24 1240  BP: 126/68 128/74  Pulse: 78 81  Resp: 16 14  Temp: (!) 36.1 C   SpO2: 99% 100%    Last Pain:  Vitals:   03/29/24 0904  TempSrc:   PainSc: 7                  Zula Hitch

## 2024-04-12 ENCOUNTER — Ambulatory Visit (INDEPENDENT_AMBULATORY_CARE_PROVIDER_SITE_OTHER): Admitting: Neurosurgery

## 2024-04-12 ENCOUNTER — Encounter: Payer: Self-pay | Admitting: Neurosurgery

## 2024-04-12 VITALS — BP 132/86 | Temp 98.6°F | Ht 66.0 in | Wt 147.0 lb

## 2024-04-12 DIAGNOSIS — Z09 Encounter for follow-up examination after completed treatment for conditions other than malignant neoplasm: Secondary | ICD-10-CM

## 2024-04-12 DIAGNOSIS — M5412 Radiculopathy, cervical region: Secondary | ICD-10-CM

## 2024-04-12 NOTE — Progress Notes (Signed)
   REFERRING PHYSICIAN:  No referring provider defined for this encounter.  DOS: 03/28/24 C5-6 arthroplasty  HISTORY OF PRESENT ILLNESS: Donna Mejia is about 2 weeks status post C5-6 arthroplasty. Overall, she is doing well post-operatively. Her pre-op radiating arm pain is much better despite ongoing numbness in her fingers and achy/ tightness in her left shoulder. She did not tolerate Tramadol  or Oxycodone  and is currently just taking Ibuprofen , Tylenol , and Robaxin  as needed.  PHYSICAL EXAMINATION:  NEUROLOGICAL:  General: In no acute distress.   Awake, alert, oriented to person, place, and time.  Pupils equal round and reactive to light.    Strength: Side Biceps Triceps Deltoid Interossei Grip Wrist Ext. Wrist Flex.  R 5 5 5 5 5 5 5   L 5 5 5 5 5 5 5    Incision c/d/I  Imaging:  No interval imaging to review today  Assessment / Plan: Donna Mejia is doing well after recent cervical arthroplasty.  We had a lengthy discussing regarding her pain medications.  I recommend that she plan to take Robaxin  at least 3 times a day as tolerated for the next several weeks to help with her muscle spasms.  A steroid taper should she still have continued pain however she would like to attempt the Robaxin  first.  We discussed potentially premedicating with Zofran  if she is having an episode of severe pain prior to taking tramadol  or oxycodone  to see if this prevents nausea.  We discussed activity escalation and I have advised the patient to lift up to 10 pounds until 6 weeks after surgery, then increase up to 25 pounds until 12 weeks after surgery.  After 12 weeks post-op, the patient advised to increase activity as tolerated. she will return to clinic in approximately 4 weeks to see Dr. Mont Antis   Advised to contact the office if any questions or concerns arise.   Anastacio Karvonen PA-C Dept of Neurosurgery

## 2024-05-07 ENCOUNTER — Other Ambulatory Visit (HOSPITAL_COMMUNITY): Payer: Self-pay

## 2024-05-09 ENCOUNTER — Other Ambulatory Visit: Payer: Self-pay

## 2024-05-09 DIAGNOSIS — M5412 Radiculopathy, cervical region: Secondary | ICD-10-CM

## 2024-05-10 ENCOUNTER — Ambulatory Visit
Admission: RE | Admit: 2024-05-10 | Discharge: 2024-05-10 | Disposition: A | Discharge status: Home / Self Care | Source: Ambulatory Visit | Attending: Neurosurgery | Admitting: Neurosurgery

## 2024-05-10 ENCOUNTER — Ambulatory Visit (INDEPENDENT_AMBULATORY_CARE_PROVIDER_SITE_OTHER): Admitting: Neurosurgery

## 2024-05-10 ENCOUNTER — Ambulatory Visit
Admission: RE | Admit: 2024-05-10 | Discharge: 2024-05-10 | Disposition: A | Discharge status: Home / Self Care | Attending: Neurosurgery | Admitting: Neurosurgery

## 2024-05-10 VITALS — BP 128/80 | Temp 98.1°F | Ht 66.0 in | Wt 147.0 lb

## 2024-05-10 DIAGNOSIS — M5412 Radiculopathy, cervical region: Secondary | ICD-10-CM | POA: Insufficient documentation

## 2024-05-10 NOTE — Progress Notes (Signed)
   REFERRING PHYSICIAN:  No referring provider defined for this encounter.  DOS: 03/28/24 C5-6 arthroplasty  HISTORY OF PRESENT ILLNESS: Donna Mejia is status post C5-6 arthroplasty.   She is doing much better.  Her numbness is improved.   PHYSICAL EXAMINATION:  NEUROLOGICAL:  General: In no acute distress.   Awake, alert, oriented to person, place, and time.  Pupils equal round and reactive to light.    Strength: Side Biceps Triceps Deltoid Interossei Grip Wrist Ext. Wrist Flex.  R 5 5 5 5 5 5 5   L 5 5 5 5 5 5 5    Incision c/d/I  Imaging:  On her imaging today, there is some evidence of change and position of her implant.  I think there is some rotation when comparing the 2 images.  Will monitor this closely.   Assessment / Plan: Donna Mejia is doing well after recent cervical arthroplasty.  She has had significant improvements.  Have given her exercises for her neck and reviewed activity restrictions.  Will continue to monitor her x-rays.  I will see her back in 6 weeks.    Jodeen Munch MD Dept of Neurosurgery

## 2024-06-14 ENCOUNTER — Encounter: Admitting: Neurosurgery

## 2024-06-15 ENCOUNTER — Other Ambulatory Visit: Payer: Self-pay

## 2024-06-15 DIAGNOSIS — M5412 Radiculopathy, cervical region: Secondary | ICD-10-CM

## 2024-06-15 DIAGNOSIS — M4802 Spinal stenosis, cervical region: Secondary | ICD-10-CM

## 2024-06-16 ENCOUNTER — Encounter: Admitting: Neurosurgery

## 2024-06-21 ENCOUNTER — Encounter: Payer: Self-pay | Admitting: Neurosurgery

## 2024-06-21 ENCOUNTER — Ambulatory Visit (INDEPENDENT_AMBULATORY_CARE_PROVIDER_SITE_OTHER): Admitting: Neurosurgery

## 2024-06-21 ENCOUNTER — Ambulatory Visit
Admission: RE | Admit: 2024-06-21 | Discharge: 2024-06-21 | Disposition: A | Discharge status: Home / Self Care | Source: Ambulatory Visit | Attending: Neurosurgery | Admitting: Neurosurgery

## 2024-06-21 ENCOUNTER — Ambulatory Visit
Admission: RE | Admit: 2024-06-21 | Discharge: 2024-06-21 | Disposition: A | Discharge status: Home / Self Care | Attending: Neurosurgery | Admitting: Neurosurgery

## 2024-06-21 VITALS — BP 122/86 | Ht 66.0 in | Wt 147.0 lb

## 2024-06-21 DIAGNOSIS — M5412 Radiculopathy, cervical region: Secondary | ICD-10-CM

## 2024-06-21 DIAGNOSIS — M4802 Spinal stenosis, cervical region: Secondary | ICD-10-CM

## 2024-06-21 DIAGNOSIS — Z09 Encounter for follow-up examination after completed treatment for conditions other than malignant neoplasm: Secondary | ICD-10-CM

## 2024-06-21 NOTE — Progress Notes (Signed)
   REFERRING PHYSICIAN:  No referring provider defined for this encounter.  DOS: 03/28/24 C5-6 arthroplasty  HISTORY OF PRESENT ILLNESS: Donna Mejia is status post C5-6 arthroplasty.   She is doing much better.  Her numbness is improved.   PHYSICAL EXAMINATION:  NEUROLOGICAL:  General: In no acute distress.   Awake, alert, oriented to person, place, and time.  Pupils equal round and reactive to light.    Strength: Side Biceps Triceps Deltoid Interossei Grip Wrist Ext. Wrist Flex.  R 5 5 5 5 5 5 5   L 5 5 5 5 5 5 5    Incision c/d/I  Imaging:  On her imaging today, the anterior tilt of the disc replacement is stable compared to the last x-ray.  There is no change.     Assessment / Plan: Donna Mejia is doing well after recent cervical arthroplasty.  She has had significant improvements.  Have given her exercises for her neck and reviewed activity restrictions.  Will continue to monitor her x-rays.  I will see her back in 3 months  Reeves Daisy MD Dept of Neurosurgery

## 2024-09-08 ENCOUNTER — Other Ambulatory Visit: Payer: Self-pay

## 2024-09-08 DIAGNOSIS — Z1231 Encounter for screening mammogram for malignant neoplasm of breast: Secondary | ICD-10-CM

## 2024-09-15 ENCOUNTER — Other Ambulatory Visit: Payer: Self-pay

## 2024-09-15 ENCOUNTER — Ambulatory Visit: Admitting: Neurosurgery

## 2024-09-15 DIAGNOSIS — M5412 Radiculopathy, cervical region: Secondary | ICD-10-CM

## 2024-09-18 ENCOUNTER — Encounter: Payer: Self-pay | Admitting: Emergency Medicine

## 2024-09-18 ENCOUNTER — Ambulatory Visit
Admission: EM | Admit: 2024-09-18 | Discharge: 2024-09-18 | Disposition: A | Discharge status: Home / Self Care | Attending: Emergency Medicine | Admitting: Emergency Medicine

## 2024-09-18 DIAGNOSIS — R300 Dysuria: Secondary | ICD-10-CM | POA: Insufficient documentation

## 2024-09-18 LAB — POCT URINE DIPSTICK
Bilirubin, UA: NEGATIVE
Glucose, UA: NEGATIVE mg/dL
Ketones, POC UA: NEGATIVE mg/dL
Leukocytes, UA: NEGATIVE
Nitrite, UA: NEGATIVE
POC PROTEIN,UA: NEGATIVE
Spec Grav, UA: 1.01 (ref 1.010–1.025)
Urobilinogen, UA: 0.2 U/dL
pH, UA: 6 (ref 5.0–8.0)

## 2024-09-18 MED ORDER — NITROFURANTOIN MONOHYD MACRO 100 MG PO CAPS: 100.0000 mg | ORAL_CAPSULE | Freq: Two times a day (BID) | ORAL | 0 refills | Status: DC

## 2024-09-18 NOTE — ED Triage Notes (Signed)
 Patient reports  lower abdominal pain, mid back pain and urinary frequency and painful urination. Patient took Uricalm, Ibuprofen  with mild relief. Rates pain abdominal pain 6/10 and back pain 4/10.

## 2024-09-18 NOTE — Discharge Instructions (Addendum)
 Your urinalysis does not show any signs of infection, your urine will be sent to the lab to determine exactly which bacteria is present, if any changes need to be made to your medications you will be notified  An alternative cause of your symptoms could be a kidney stone which has to be visualized on abdominal ultrasound or CT which unfortunately we do not have available here today in the clinic  Begin use of Macrobid twice daily for 5 days as you are symptomatic  You may use over-the-counter Azo to help minimize your symptoms until antibiotic removes bacteria, this medication will turn your urine orange  Increase your fluid intake through use of water  As always practice good hygiene, wiping front to back and avoidance of scented vaginal products to prevent further irritation  If all testing is negative and you continue to have symptoms please schedule follow-up appointment with your primary doctor for further manage

## 2024-09-18 NOTE — ED Provider Notes (Signed)
 [facility]    CSN: [id number] Arrival date & time: 09/18/24  9046      History   Chief Complaint Chief Complaint  Patient presents with   Abdominal Pain   Back Pain   Urinary Frequency   Dysuria    HPI Donna Mejia is a 43 y.o. female.   Patient presents for evaluation of intermittent lower abdominal cramping, centralized lower abdominal pain, urinary frequency, urgency, dysuria and a burning sensation without urination present for 3 days.  Progressively worsening.  Has begun to experience nausea without vomiting this morning.  Tolerable to food and liquids.  Denies fever, hematuria, vaginal symptoms.  Past Medical History:  Diagnosis Date   Anemia    Anxiety    Complication of anesthesia    delayed emergence after hysterectomy   Dysmenorrhea    Family history of adverse reaction to anesthesia    MOM-N/V   Foraminal stenosis of cervical region    Hyperthyroidism    Hypothyroidism    IMB (intermenstrual bleeding)    PCOD (polycystic ovarian disease)    Pelvic pain in female    PONV (postoperative nausea and vomiting)    nausea only after lumpectomy   Pre-diabetes    Radiculopathy of cervical spine     Patient Active Problem List   Diagnosis Date Noted   Cervical radiculopathy 03/28/2024   Cervical spinal stenosis 03/28/2024   Anxiety 05/16/2016    Past Surgical History:  Procedure Laterality Date   BREAST BIOPSY Left 11/19/2018   Sclerosing lesion   BREAST BIOPSY Left 12/03/2018   Procedure: BREAST BIOPSY WITH NEEDLE LOCALIZATION;  Surgeon: Rodolph Romano, MD;  Location: [facility];  Service: General;  Laterality: Left;   BREAST EXCISIONAL BIOPSY Left 12/03/2018   Sclerosing lesion   BREAST LUMPECTOMY Left    sclerosing lesion   CERVICAL DISC ARTHROPLASTY N/A 03/28/2024   Procedure: CERVICAL ANTERIOR DISC ARTHROPLASTY;  Surgeon: Clois Fret, MD;  Location: [facility];  Service: Neurosurgery;  Laterality: N/A;  C5-6 ARTHROPLASTY    DILATION AND CURETTAGE OF UTERUS     HYSTEROSCOPY  08/2010   secretory endometrium   TOTAL LAPAROSCOPIC HYSTERECTOMY WITH SALPINGECTOMY Bilateral 11/05/2020   Procedure: TOTAL LAPAROSCOPIC HYSTERECTOMY WITH  BILATERAL  SALPINGECTOMY;  Surgeon: Ward, Mitzie BROCKS, MD;  Location: [facility];  Service: Gynecology;  Laterality: Bilateral;    OB History     Gravida  2   Para  2   Term  1   Preterm  1   AB      Living  2      SAB      IAB      Ectopic      Multiple      Live Births  2            Home Medications    Prior to Admission medications   Medication Sig Start Date End Date Taking? Authorizing Provider  nitrofurantoin, macrocrystal-monohydrate, (MACROBID) 100 MG capsule Take 1 capsule (100 mg total) by mouth 2 (two) times daily. 09/18/24  Yes Kamdyn Colborn, Shelba SAUNDERS, NP  cetirizine (ZYRTEC) 10 MG tablet Take 10 mg by mouth daily as needed for allergies.    [provider]  methimazole (TAPAZOLE) 5 MG tablet Take 2.5 mg by mouth in the morning.    [provider]  Multiple Vitamin (MULTIVITAMIN WITH MINERALS) TABS tablet Take 1 tablet by mouth every other day.    [provider]  OVER THE COUNTER MEDICATION Take 1  Scoop by mouth as needed (Calm Magnesium).    [provider]    Family History Family History  Problem Relation Age of Onset   Endometriosis Mother    Heart disease Mother    Diabetes Father    Heart disease Maternal Uncle    Heart disease Maternal Grandmother    Cancer Paternal Grandmother        colon   Diabetes Paternal Grandmother    Breast cancer Cousin        maternal    Social History Social History   Tobacco Use   Smoking status: Never   Smokeless tobacco: Never  Vaping Use   Vaping status: Never Used  Substance Use Topics   Alcohol use: No   Drug use: No     Allergies   Levaquin [levofloxacin] and Penicillins   Review of Systems Review of Systems   Physical Exam Triage Vital Signs ED  Triage Vitals  Encounter Vitals Group     BP 09/18/24 1005 (!) 141/82     Girls Systolic BP Percentile --      Girls Diastolic BP Percentile --      Boys Systolic BP Percentile --      Boys Diastolic BP Percentile --      Pulse Rate 09/18/24 1005 67     Resp 09/18/24 1005 18     Temp 09/18/24 1005 98.4 F (36.9 C)     Temp Source 09/18/24 1005 Oral     SpO2 09/18/24 1005 98 %     Weight --      Height --      Head Circumference --      Peak Flow --      Pain Score 09/18/24 1008 6     Pain Loc --      Pain Education --      Exclude from Growth Chart --    No data found.  Updated Vital Signs BP (!) 141/82 (BP Location: Left Arm)   Pulse 67   Temp 98.4 F (36.9 C) (Oral)   Resp 18   LMP 10/23/2020 (Approximate)   SpO2 98%   Visual Acuity Right Eye Distance:   Left Eye Distance:   Bilateral Distance:    Right Eye Near:   Left Eye Near:    Bilateral Near:     Physical Exam Constitutional:      Appearance: Normal appearance.  Pulmonary:     Effort: Pulmonary effort is normal.  Abdominal:     Tenderness: There is abdominal tenderness in the suprapubic area. There is no right CVA tenderness or left CVA tenderness.  Neurological:     Mental Status: She is alert and oriented to person, place, and time.      UC Treatments / Results  Labs (all labs ordered are listed, but only abnormal results are displayed) Labs Reviewed  POCT URINE DIPSTICK - Abnormal; Notable for the following components:      Result Value   Blood, UA trace-intact (*)    All other components within normal limits  URINE CULTURE    EKG   Radiology No results found.  Procedures Procedures (including critical care time)  Medications Ordered in UC Medications - No data to display  Initial Impression / Assessment and Plan / UC Course  I have reviewed the triage vital signs and the nursing notes.  Pertinent labs & imaging results that were available during my care of the patient were  reviewed by me and considered  in my medical decision making (see chart for details).  Dysuria  Urinalysis negative for leukocytes and nitrates, sent for culture, trace blood noted, empirically placed on Macrobid if she is symptomatic, discussed off turn as an etiology of kidney stone, recommended over-the-counter medications and nonpharmacological supportive care advised PCP follow-up if all testing is negative and symptoms continue to persist Final Clinical Impressions(s) / UC Diagnoses   Final diagnoses:  Dysuria     Discharge Instructions      Your urinalysis does not show any signs of infection, your urine will be sent to the lab to determine exactly which bacteria is present, if any changes need to be made to your medications you will be notified  An alternative cause of your symptoms could be a kidney stone which has to be visualized on abdominal ultrasound or CT which unfortunately we do not have available here today in the clinic  Begin use of Macrobid twice daily for 5 days as you are symptomatic  You may use over-the-counter Azo to help minimize your symptoms until antibiotic removes bacteria, this medication will turn your urine orange  Increase your fluid intake through use of water  As always practice good hygiene, wiping front to back and avoidance of scented vaginal products to prevent further irritation  If all testing is negative and you continue to have symptoms please schedule follow-up appointment with your primary doctor for further manage    ED Prescriptions     Medication Sig Dispense Auth. Provider   nitrofurantoin, macrocrystal-monohydrate, (MACROBID) 100 MG capsule Take 1 capsule (100 mg total) by mouth 2 (two) times daily. 10 capsule Makella Buckingham R, NP      PDMP not reviewed this encounter.   Teresa Shelba SAUNDERS, NP 09/18/24 1028

## 2024-09-19 LAB — URINE CULTURE: Culture: NO GROWTH

## 2024-09-20 ENCOUNTER — Ambulatory Visit (INDEPENDENT_AMBULATORY_CARE_PROVIDER_SITE_OTHER): Admitting: Neurosurgery

## 2024-09-20 ENCOUNTER — Ambulatory Visit

## 2024-09-20 ENCOUNTER — Ambulatory Visit (HOSPITAL_COMMUNITY): Payer: Self-pay

## 2024-09-20 VITALS — BP 120/82 | Wt 150.2 lb

## 2024-09-20 DIAGNOSIS — Z981 Arthrodesis status: Secondary | ICD-10-CM

## 2024-09-20 DIAGNOSIS — Z09 Encounter for follow-up examination after completed treatment for conditions other than malignant neoplasm: Secondary | ICD-10-CM | POA: Diagnosis not present

## 2024-09-20 DIAGNOSIS — M5412 Radiculopathy, cervical region: Secondary | ICD-10-CM | POA: Diagnosis not present

## 2024-09-20 NOTE — Addendum Note (Signed)
 Addended by: BENN AUSTON HERO on: 09/20/2024 08:39 AM   Modules accepted: Orders

## 2024-09-20 NOTE — Progress Notes (Signed)
   REFERRING PHYSICIAN:  No referring provider defined for this encounter.  DOS: 03/28/24 C5-6 arthroplasty  HISTORY OF PRESENT ILLNESS: Donna Mejia is status post C5-6 arthroplasty.   She is doing well. She takes occasional meloxicam.   PHYSICAL EXAMINATION:  NEUROLOGICAL:  General: In no acute distress.   Awake, alert, oriented to person, place, and time.  Pupils equal round and reactive to light.    Strength: Side Biceps Triceps Deltoid Interossei Grip Wrist Ext. Wrist Flex.  R 5 5 5 5 5 5 5   L 5 5 5 5 5 5 5    Incision c/d/I  Imaging:  On her imaging today, the anterior tilt of the disc replacement is stable compared to the last x-ray.  There is no significant change.     Assessment / Plan: Donna Mejia is doing well after recent cervical arthroplasty.  She has had significant improvements.  Have given her exercises for her neck and reviewed activity restrictions.  Will continue to monitor her x-rays.  I will see her back in 6 months  Reeves Daisy MD Dept of Neurosurgery

## 2024-09-22 ENCOUNTER — Ambulatory Visit: Admitting: Neurosurgery

## 2024-09-23 ENCOUNTER — Other Ambulatory Visit: Payer: Self-pay

## 2024-09-23 DIAGNOSIS — R1030 Lower abdominal pain, unspecified: Secondary | ICD-10-CM

## 2024-09-28 ENCOUNTER — Ambulatory Visit
Admission: RE | Admit: 2024-09-28 | Discharge: 2024-09-28 | Disposition: A | Discharge status: Home / Self Care | Source: Ambulatory Visit

## 2024-09-28 DIAGNOSIS — R1030 Lower abdominal pain, unspecified: Secondary | ICD-10-CM

## 2024-09-28 MED ORDER — IOPAMIDOL (ISOVUE-300) INJECTION 61%
100.0000 mL | Freq: Once | INTRAVENOUS | Status: AC | PRN
Start: 2024-09-28 — End: 2024-09-28
  Administered 2024-09-28: 100 mL via INTRAVENOUS

## 2024-10-21 ENCOUNTER — Ambulatory Visit
Admission: RE | Admit: 2024-10-21 | Discharge: 2024-10-21 | Disposition: A | Discharge status: Home / Self Care | Source: Ambulatory Visit

## 2024-10-21 DIAGNOSIS — Z1231 Encounter for screening mammogram for malignant neoplasm of breast: Secondary | ICD-10-CM | POA: Diagnosis present

## 2025-03-21 ENCOUNTER — Other Ambulatory Visit

## 2025-03-21 ENCOUNTER — Ambulatory Visit: Admitting: Neurosurgery
